# Patient Record
Sex: Male | Born: 1960 | Hispanic: Yes | Marital: Married | Smoking: Former smoker
Health system: Southern US, Community
[De-identification: ages and names within clinical notes are randomized; demographics above are authoritative.]

## PROBLEM LIST (undated history)

## (undated) DIAGNOSIS — Z955 Presence of coronary angioplasty implant and graft: Secondary | ICD-10-CM

## (undated) DIAGNOSIS — E1169 Type 2 diabetes mellitus with other specified complication: Secondary | ICD-10-CM

## (undated) DIAGNOSIS — E7800 Pure hypercholesterolemia, unspecified: Secondary | ICD-10-CM

## (undated) DIAGNOSIS — I252 Old myocardial infarction: Secondary | ICD-10-CM

## (undated) DIAGNOSIS — I10 Essential (primary) hypertension: Secondary | ICD-10-CM

## (undated) DIAGNOSIS — E6609 Other obesity due to excess calories: Secondary | ICD-10-CM

## (undated) DIAGNOSIS — R7303 Prediabetes: Secondary | ICD-10-CM

## (undated) DIAGNOSIS — I255 Ischemic cardiomyopathy: Secondary | ICD-10-CM

## (undated) DIAGNOSIS — E119 Type 2 diabetes mellitus without complications: Secondary | ICD-10-CM

## (undated) DIAGNOSIS — R7301 Impaired fasting glucose: Secondary | ICD-10-CM

## (undated) DIAGNOSIS — D2310 Other benign neoplasm of skin of unspecified eyelid, including canthus: Secondary | ICD-10-CM

## (undated) DIAGNOSIS — I2510 Atherosclerotic heart disease of native coronary artery without angina pectoris: Secondary | ICD-10-CM

## (undated) DIAGNOSIS — F341 Dysthymic disorder: Secondary | ICD-10-CM

## (undated) DIAGNOSIS — E782 Mixed hyperlipidemia: Secondary | ICD-10-CM

## (undated) HISTORY — DX: Other benign neoplasm of skin of unspecified eyelid, including canthus: D23.10

## (undated) HISTORY — DX: Other obesity due to excess calories: E66.09

## (undated) HISTORY — DX: Ischemic cardiomyopathy: I25.5

## (undated) HISTORY — DX: Dysthymic disorder: F34.1

## (undated) HISTORY — DX: Type 2 diabetes mellitus without complications: E11.9

## (undated) HISTORY — DX: Type 2 diabetes mellitus with other specified complication: E78.2

## (undated) HISTORY — PX: CORONARY STENT PLACEMENT: SHX1402

## (undated) HISTORY — DX: Presence of coronary angioplasty implant and graft: Z95.5

## (undated) HISTORY — DX: Impaired fasting glucose: R73.01

## (undated) HISTORY — DX: Essential (primary) hypertension: I10

## (undated) HISTORY — DX: Type 2 diabetes mellitus with other specified complication: E11.69

## (undated) HISTORY — DX: Old myocardial infarction: I25.2

## (undated) HISTORY — DX: Atherosclerotic heart disease of native coronary artery without angina pectoris: I25.10

## (undated) HISTORY — PX: CATARACT EXTRACTION, BILATERAL: SHX1313

## (undated) HISTORY — DX: Prediabetes: R73.03

## (undated) HISTORY — DX: Pure hypercholesterolemia, unspecified: E78.00

---

## 2011-01-18 LAB — HM COLONOSCOPY

## 2018-09-17 ENCOUNTER — Ambulatory Visit (INDEPENDENT_AMBULATORY_CARE_PROVIDER_SITE_OTHER): Payer: 59 | Admitting: Osteopathic Medicine

## 2018-09-17 ENCOUNTER — Encounter: Payer: Self-pay | Admitting: Osteopathic Medicine

## 2018-09-17 VITALS — Temp 99.0°F | Ht 70.0 in | Wt 235.0 lb

## 2018-09-17 DIAGNOSIS — I517 Cardiomegaly: Secondary | ICD-10-CM | POA: Insufficient documentation

## 2018-09-17 DIAGNOSIS — I252 Old myocardial infarction: Secondary | ICD-10-CM | POA: Diagnosis not present

## 2018-09-17 DIAGNOSIS — I2119 ST elevation (STEMI) myocardial infarction involving other coronary artery of inferior wall: Secondary | ICD-10-CM | POA: Diagnosis not present

## 2018-09-17 DIAGNOSIS — F640 Transsexualism: Secondary | ICD-10-CM | POA: Insufficient documentation

## 2018-09-17 DIAGNOSIS — E1169 Type 2 diabetes mellitus with other specified complication: Secondary | ICD-10-CM | POA: Insufficient documentation

## 2018-09-17 DIAGNOSIS — R7303 Prediabetes: Secondary | ICD-10-CM

## 2018-09-17 DIAGNOSIS — Z0000 Encounter for general adult medical examination without abnormal findings: Secondary | ICD-10-CM

## 2018-09-17 HISTORY — DX: Transsexualism: F64.0

## 2018-09-17 NOTE — Progress Notes (Signed)
Virtual Visit via Video (App used: Doximity) Note  I connected with      [name] on 09/17/18 at 4:40 PM by a telemedicine application and verified that I am speaking with the correct person using two identifiers.  Patient is at home I am in office   I discussed the limitations of evaluation and management by telemedicine and the availability of in person appointments. The patient expressed understanding and agreed to proceed.  History of Present Illness: Eric Newton is a 58 y.o. transfeminie but goes by male pronouns and nickname "Eric Newton" who would like to discuss new to establish care    Very pleasant patient new to establish care.  Specifically asked about preferred name/pronouns.  Patient states that they go by masculine pronouns and identify as male "because insurance will not cover my transition."  Speaks very good English, I am not sure if there might be some kind of language barrier here or if patient truly just feels more comfortable going by assigned male name and pronouns, other notes in previous charts mention patient goes by male pronouns.  (Can address this more at future visit) patient is on male hormone therapy.  Reports history of MI x2 several years ago.  Patient appears to be on appropriate secondary prevention with aspirin, statin, metoprolol and is under the care of cardiology.  Last visit 08/10/2018.  Recommending dual antiplatelet therapy as long as patient is able to do so safely, caution with continuation of hormone therapy but certainly reasonable to continue male hormones for quality-of-life purposes.  Reports history of prediabetes.  Last A1c was at goal a few months ago.  Gets blood work about every 6 months or so.      Observations/Objective: Temp 99 F (37.2 C) (Oral)   Ht 5\' 10"  (1.778 m)   Wt 235 lb (106.6 kg)   BMI 33.72 kg/m  BP Readings from Last 3 Encounters:  No data found for BP   Exam: Normal Speech.  NAD  Lab and  Radiology Results No results found for this or any previous visit (from the past 72 hour(s)). No results found.     Assessment and Plan: 58 y.o. male with The primary encounter diagnosis was History of inferior wall myocardial infarction. 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   PDMP not reviewed this encounter. No orders of the defined types were placed in this encounter.  No orders of the defined types were placed in this encounter.  Patient Instructions  Plan:  Orders are in place for you to get blood work done the week prior to your annual visit which will be due in December.  Please come to the lab on the first floor of the building to get blood work done, you do not need an appointment, please come fasting meaning no food after midnight that you can take your medications as usual with a sip of water.  Please let me know if you are coming due for refills.  Have your pharmacy contact my office with refill requests if needed.  Looking forward to meeting you face-to-face in December, please let me know sooner if any other issues, and please feel free to schedule with 1 of our sports medicine physicians if hip pain worsens or changes!     Instructions sent via MyChart. If MyChart not available, pt was given option for info via personal e-mail w/ no  guarantee of protected health info over unsecured e-mail communication, and MyChart sign-up instructions were included.   Follow Up Instructions: Return in about 3 months (around 12/17/2018) for ANNUAL (get labs prior to visit, orders are in), VISIT [place].    I discussed the assessment and treatment plan with the patient. The patient was provided an opportunity to ask questions and all were answered. The patient agreed with the plan and demonstrated an understanding of the  instructions.   The patient was advised to call back or seek an in-person evaluation if any new concerns, if symptoms worsen or if the condition fails to improve as anticipated.  30 minutes of non-face-to-face time was provided during this encounter.    Labs ordered for future visit. Annual physical / preventive care was NOT performed or billed today.                    Historical information moved to improve visibility of documentation.  Past Medical History:  Diagnosis Date  . High cholesterol   . Hypertension   . Pre-diabetes    Past Surgical History:  Procedure Laterality Date  . CATARACT EXTRACTION, BILATERAL     30 years ago  . CORONARY STENT PLACEMENT     2006 and 2019   Social History   Tobacco Use  . Smoking status: Former Smoker    Types: Cigarettes    Quit date: 2014    Years since quitting: 6.7  . Smokeless tobacco: Former Systems developer  . Tobacco comment: 2-3 cigs a day when he did smoke  Substance Use Topics  . Alcohol use: Yes    Alcohol/week: 3.0 standard drinks    Types: 3 Standard drinks or equivalent per week    Comment: Drinks socially on weekends   family history includes Breast cancer in his mother; Cancer in his maternal grandfather and maternal grandmother; Hypertension in his mother; Prostate cancer in his father.  Medications: Current Outpatient Medications  Medication Sig Dispense Refill  . albuterol (VENTOLIN HFA) 108 (90 Base) MCG/ACT inhaler Inhale into the lungs.    . ASPIRIN 81 PO Aspir-81    . b complex vitamins tablet Take by mouth.    . escitalopram (LEXAPRO) 10 MG tablet TAKE 1 TABLET BY MOUTH EVERY DAY    . estradiol (ESTRACE) 2 MG tablet Take 4 mg by mouth daily.     [name] ezetimibe (ZETIA) 10 MG tablet Take by mouth.    . fenofibrate (TRICOR) 145 MG tablet Take by mouth.    [name] ketoconazole (NIZORAL) 2 % cream Apply topically.    . medroxyPROGESTERone (PROVERA) 2.5 MG tablet Take 2.5 mg by mouth daily.     . metFORMIN  (GLUCOPHAGE) 500 MG tablet metformin 500 mg tablet  TAKE 1 TABLET (500 MG TOTAL) BY MOUTH 2 (TWO) TIMES DAILY WITH MEALS.    . methocarbamol (ROBAXIN) 750 MG tablet TK 1 T PO Q 4 H PRN    . metoprolol succinate (TOPROL-XL) 25 MG 24 hr tablet metoprolol succinate ER 25 mg tablet,extended release 24 hr  TAKE 1 TABLET (25 MG TOTAL) BY MOUTH DAILY.    . nitroGLYCERIN (NITROSTAT) 0.4 MG SL tablet Nitrostat 0.4 mg sublingual tablet  Place 1 tablet as needed by sublingual route.    . prasugrel (EFFIENT) 10 MG TABS tablet Effient 10 mg tablet  TAKE 1 TABLET EVERY DAY    . rosuvastatin (CRESTOR) 40 MG tablet rosuvastatin 40 mg tablet  TAKE 1 TABLET (  40 MG TOTAL) BY MOUTH DAILY.    [name] spironolactone (ALDACTONE) 100 MG tablet One tab QAM and half tab QPM    . triamcinolone ointment (KENALOG) 0.1 % Apply topically.    . clopidogrel (PLAVIX) 75 MG tablet Take by mouth.     No current facility-administered medications for this visit.    No Known Allergies  PDMP not reviewed this encounter. No orders of the defined types were placed in this encounter.  No orders of the defined types were placed in this encounter.

## 2018-09-17 NOTE — Progress Notes (Signed)
Tried contacting pt at 415 pm, no answer. Unable to leave a vm msg. Vm not set up.

## 2018-09-17 NOTE — Patient Instructions (Signed)
Plan:  Orders are in place for you to get blood work done the week prior to your annual visit which will be due in December.  Please come to the lab on the first floor of the building to get blood work done, you do not need an appointment, please come fasting meaning no food after midnight that you can take your medications as usual with a sip of water.  Please let me know if you are coming due for refills.  Have your pharmacy contact my office with refill requests if needed.  Looking forward to meeting you face-to-face in December, please let me know sooner if any other issues, and please feel free to schedule with 1 of our sports medicine physicians if hip pain worsens or changes!

## 2018-09-19 ENCOUNTER — Encounter: Payer: Self-pay | Admitting: Osteopathic Medicine

## 2018-10-21 ENCOUNTER — Encounter: Payer: Self-pay | Admitting: Osteopathic Medicine

## 2018-10-21 DIAGNOSIS — Z1231 Encounter for screening mammogram for malignant neoplasm of breast: Secondary | ICD-10-CM

## 2018-10-22 NOTE — Telephone Encounter (Signed)
Mammogram order pended with DX of screening for breast cancer.. please review order and let me know if this is OK?

## 2018-10-22 NOTE — Telephone Encounter (Signed)
Signed.

## 2018-10-25 ENCOUNTER — Encounter: Payer: 59 | Admitting: Osteopathic Medicine

## 2018-10-27 ENCOUNTER — Encounter: Payer: Self-pay | Admitting: Osteopathic Medicine

## 2018-11-13 ENCOUNTER — Telehealth: Payer: Self-pay | Admitting: Osteopathic Medicine

## 2018-11-13 ENCOUNTER — Encounter: Payer: Self-pay | Admitting: Osteopathic Medicine

## 2018-11-13 DIAGNOSIS — R7303 Prediabetes: Secondary | ICD-10-CM

## 2018-11-13 MED ORDER — METFORMIN HCL 500 MG PO TABS: ORAL_TABLET | ORAL | 0 refills | Status: DC

## 2018-11-13 NOTE — Telephone Encounter (Signed)
Med refilled.

## 2018-11-13 NOTE — Telephone Encounter (Signed)
Ordered A1C for his physical in Decewmber. KG LPN

## 2018-11-22 ENCOUNTER — Ambulatory Visit (INDEPENDENT_AMBULATORY_CARE_PROVIDER_SITE_OTHER): Payer: 59

## 2018-11-22 ENCOUNTER — Other Ambulatory Visit: Payer: Self-pay

## 2018-11-22 DIAGNOSIS — Z1231 Encounter for screening mammogram for malignant neoplasm of breast: Secondary | ICD-10-CM

## 2018-11-28 ENCOUNTER — Other Ambulatory Visit: Payer: Self-pay | Admitting: Osteopathic Medicine

## 2018-11-28 DIAGNOSIS — R928 Other abnormal and inconclusive findings on diagnostic imaging of breast: Secondary | ICD-10-CM

## 2018-12-06 ENCOUNTER — Ambulatory Visit
Admission: RE | Admit: 2018-12-06 | Discharge: 2018-12-06 | Disposition: A | Discharge status: Home / Self Care | Payer: 59 | Source: Ambulatory Visit | Attending: Osteopathic Medicine | Admitting: Osteopathic Medicine

## 2018-12-06 ENCOUNTER — Other Ambulatory Visit: Payer: Self-pay

## 2018-12-06 DIAGNOSIS — R928 Other abnormal and inconclusive findings on diagnostic imaging of breast: Secondary | ICD-10-CM

## 2018-12-10 ENCOUNTER — Other Ambulatory Visit: Payer: Self-pay

## 2018-12-10 ENCOUNTER — Encounter: Payer: Self-pay | Admitting: Osteopathic Medicine

## 2018-12-10 DIAGNOSIS — E782 Mixed hyperlipidemia: Secondary | ICD-10-CM

## 2018-12-10 DIAGNOSIS — Z125 Encounter for screening for malignant neoplasm of prostate: Secondary | ICD-10-CM

## 2018-12-10 DIAGNOSIS — Z0000 Encounter for general adult medical examination without abnormal findings: Secondary | ICD-10-CM

## 2018-12-10 DIAGNOSIS — F640 Transsexualism: Secondary | ICD-10-CM

## 2018-12-10 DIAGNOSIS — R7303 Prediabetes: Secondary | ICD-10-CM

## 2018-12-10 MED ORDER — METFORMIN HCL 500 MG PO TABS: ORAL_TABLET | ORAL | 0 refills | Status: DC

## 2018-12-12 ENCOUNTER — Other Ambulatory Visit: Payer: Self-pay

## 2018-12-12 DIAGNOSIS — R7303 Prediabetes: Secondary | ICD-10-CM

## 2018-12-12 DIAGNOSIS — E782 Mixed hyperlipidemia: Secondary | ICD-10-CM

## 2018-12-12 DIAGNOSIS — F640 Transsexualism: Secondary | ICD-10-CM

## 2018-12-12 DIAGNOSIS — Z0000 Encounter for general adult medical examination without abnormal findings: Secondary | ICD-10-CM

## 2018-12-18 LAB — COMPREHENSIVE METABOLIC PANEL
ALT: 24 IU/L (ref 0–44)
AST: 25 IU/L (ref 0–40)
Albumin/Globulin Ratio: 1.8 (ref 1.2–2.2)
Albumin: 4.2 g/dL (ref 3.8–4.9)
Alkaline Phosphatase: 58 IU/L (ref 39–117)
BUN/Creatinine Ratio: 15 (ref 9–20)
BUN: 20 mg/dL (ref 6–24)
Bilirubin Total: 0.4 mg/dL (ref 0.0–1.2)
CO2: 24 mmol/L (ref 20–29)
Calcium: 9 mg/dL (ref 8.7–10.2)
Chloride: 101 mmol/L (ref 96–106)
Creatinine, Ser: 1.33 mg/dL — ABNORMAL HIGH (ref 0.76–1.27)
GFR calc Af Amer: 67 mL/min/{1.73_m2} (ref >59)
GFR calc non Af Amer: 58 mL/min/{1.73_m2} — ABNORMAL LOW (ref >59)
Globulin, Total: 2.3 g/dL (ref 1.5–4.5)
Glucose: 115 mg/dL — ABNORMAL HIGH (ref 65–99)
Potassium: 5.1 mmol/L (ref 3.5–5.2)
Sodium: 136 mmol/L (ref 134–144)
Total Protein: 6.5 g/dL (ref 6.0–8.5)

## 2018-12-18 LAB — LIPID PANEL WITH LDL/HDL RATIO
Cholesterol, Total: 111 mg/dL (ref 100–199)
HDL: 36 mg/dL — ABNORMAL LOW (ref >39)
LDL Chol Calc (NIH): 45 mg/dL (ref 0–99)
LDL/HDL Ratio: 1.3 ratio (ref 0.0–3.6)
Triglycerides: 182 mg/dL — ABNORMAL HIGH (ref 0–149)
VLDL Cholesterol Cal: 30 mg/dL (ref 5–40)

## 2018-12-18 LAB — CBC WITH DIFFERENTIAL/PLATELET
Basophils Absolute: 0.1 10*3/uL (ref 0.0–0.2)
Basos: 1 %
EOS (ABSOLUTE): 0.4 10*3/uL (ref 0.0–0.4)
Eos: 6 %
Hematocrit: 42.3 % (ref 37.5–51.0)
Hemoglobin: 13.8 g/dL (ref 13.0–17.7)
Immature Grans (Abs): 0 10*3/uL (ref 0.0–0.1)
Immature Granulocytes: 0 %
Lymphocytes Absolute: 1.6 10*3/uL (ref 0.7–3.1)
Lymphs: 21 %
MCH: 30 pg (ref 26.6–33.0)
MCHC: 32.6 g/dL (ref 31.5–35.7)
MCV: 92 fL (ref 79–97)
Monocytes Absolute: 0.8 10*3/uL (ref 0.1–0.9)
Monocytes: 11 %
Neutrophils Absolute: 4.7 10*3/uL (ref 1.4–7.0)
Neutrophils: 61 %
Platelets: 231 10*3/uL (ref 150–450)
RBC: 4.6 x10E6/uL (ref 4.14–5.80)
RDW: 13.2 % (ref 11.6–15.4)
WBC: 7.7 10*3/uL (ref 3.4–10.8)

## 2018-12-18 LAB — PSA TOTAL (REFLEX TO FREE): Prostate Specific Ag, Serum: 0.2 ng/mL (ref 0.0–4.0)

## 2018-12-18 LAB — HEMOGLOBIN A1C
Est. average glucose Bld gHb Est-mCnc: 146 mg/dL
Hgb A1c MFr Bld: 6.7 % — ABNORMAL HIGH (ref 4.8–5.6)

## 2018-12-18 LAB — TESTOSTERONE: Testosterone: 350 ng/dL (ref 264–916)

## 2018-12-20 ENCOUNTER — Other Ambulatory Visit: Payer: Self-pay

## 2018-12-20 ENCOUNTER — Ambulatory Visit (INDEPENDENT_AMBULATORY_CARE_PROVIDER_SITE_OTHER): Payer: 59 | Admitting: Osteopathic Medicine

## 2018-12-20 ENCOUNTER — Encounter: Payer: Self-pay | Admitting: Osteopathic Medicine

## 2018-12-20 VITALS — BP 124/78 | HR 69 | Temp 98.1°F | Wt 240.0 lb

## 2018-12-20 DIAGNOSIS — I252 Old myocardial infarction: Secondary | ICD-10-CM | POA: Diagnosis not present

## 2018-12-20 DIAGNOSIS — R7989 Other specified abnormal findings of blood chemistry: Secondary | ICD-10-CM

## 2018-12-20 DIAGNOSIS — R7303 Prediabetes: Secondary | ICD-10-CM

## 2018-12-20 DIAGNOSIS — Z0000 Encounter for general adult medical examination without abnormal findings: Secondary | ICD-10-CM | POA: Diagnosis not present

## 2018-12-20 NOTE — Patient Instructions (Addendum)
General Preventive Care  Most recent routine screening lipids/other labs: already done!   Tobacco: don't!   Alcohol: responsible moderation is ok for most adults - if you have concerns about your alcohol intake, please talk to me!   Exercise: as tolerated to reduce risk of cardiovascular disease and diabetes. Strength training will also prevent osteoporosis.   Mental health: if need for mental health care (medicines, counseling, other), or concerns about moods, please let me know!   Sexual health: if need for STD testing, or if concerns with libido/pain problems, please let me know!   Advanced Directive: Living Will and/or Healthcare Power of Attorney recommended for all adults, regardless of age or health.  Vaccines  Flu vaccine: recommended for almost everyone, every fall.   Shingles vaccine: all done!  Pneumonia vaccines: will update at age 50  Tetanus booster: Tdap recommended every 10 years. Due 2025 Cancer screenings   Colon cancer screening: need records / date of test for colonoscopy!   Prostate cancer screening: PSA blood test around age 59 w/ annual blood test - this was normal  Breast cancer screening: mammogram recommended annually  Lung cancer screening: CT chest every year for those age 107 to 59 years old with ?30 pack year smoking history, who either currently smoke or have quit within the past 15 years. Infection screenings . HIV: recommended screening at least once age 4-65, more often as needed. . Gonorrhea/Chlamydia: screening as needed.. . Hepatitis C: recommended for anyone born 60-1966 . TB: certain at-risk populations, or depending on work requirements and/or travel history Other . Bone Density Test: recommended at age 23 . Abdominal Aortic Aneurysm: screening with ultrasound recommended once age 48-75 since you used to be a smoker

## 2018-12-20 NOTE — Progress Notes (Signed)
HPI: Eric Newton is a 58 y.o. 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  he presents to [facility] today, 12/20/18,  for chief complaint of:  Annual physical  Patient has no major complaints or concerns today.  This is a very pleasant gender nonconforming individual who identifies as male but in general/in public goes by male name/pronouns.  Cardiology notes: Plavix, ASA  She has stopped taking the progesterone and feels about the same off of this medicine is on it.  Would rather just stay off.     Past medical, surgical, social and family history reviewed:  Patient Active Problem List   Diagnosis Date Noted  . 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 History of inferior wall myocardial infarction 09/17/2018  . Additional heart attack (inferolateral wall) ([place]) 09/17/2018  . Left atrial enlargement 09/17/2018  . Prediabetes 09/17/2018    Past Surgical History:  Procedure Laterality Date  . CATARACT EXTRACTION, BILATERAL     30 years ago  . CORONARY STENT PLACEMENT     2006 and 2019    Social History   Tobacco Use  . Smoking status: Former Smoker    Types: Cigarettes    Quit date: 2014    Years since quitting: 6.9  . Smokeless tobacco: Former Systems developer  . Tobacco comment: 2-3 cigs a day when he  did smoke  Substance Use Topics  . Alcohol use: Yes    Alcohol/week: 3.0 standard drinks    Types: 3 Standard drinks or equivalent per week    Comment: Drinks socially on weekends    Family History  Problem Relation Age of Onset  . Breast cancer Mother 68  . Hypertension Mother   . Prostate cancer Father   . Cancer Maternal Grandmother   . Cancer Maternal Grandfather      Current medication list and allergy/intolerance information reviewed:    Current Outpatient Medications  Medication Sig Dispense Refill  . albuterol (VENTOLIN HFA) 108 (90 Base) MCG/ACT inhaler Inhale into the lungs.    . ASPIRIN 81 PO Aspir-81    . b complex vitamins tablet Take by mouth.    . clopidogrel (PLAVIX) 75 MG tablet Take by mouth.    . escitalopram (LEXAPRO) 10 MG tablet TAKE 1 TABLET BY MOUTH EVERY DAY    . estradiol (ESTRACE) 2 MG tablet Take 4 mg by mouth daily.     [name] ezetimibe (ZETIA) 10 MG tablet Take by mouth.    . fenofibrate (TRICOR) 145 MG tablet Take by mouth.    [name] ketoconazole (NIZORAL) 2 % cream Apply topically.    . medroxyPROGESTERone (PROVERA) 2.5 MG tablet Take 2.5 mg by mouth daily.     . metFORMIN (GLUCOPHAGE) 500 MG tablet metformin 500 mg tablet  TAKE 1 TABLET (500 MG TOTAL) BY MOUTH 2 (TWO) TIMES DAILY WITH MEALS. 60 tablet 0  . methocarbamol (ROBAXIN) 750 MG tablet TK 1 T PO Q 4 H PRN    .  metoprolol succinate (TOPROL-XL) 25 MG 24 hr tablet metoprolol succinate ER 25 mg tablet,extended release 24 hr  TAKE 1 TABLET (25 MG TOTAL) BY MOUTH DAILY.    . nitroGLYCERIN (NITROSTAT) 0.4 MG SL tablet Nitrostat 0.4 mg sublingual tablet  Place 1 tablet as needed by sublingual route.    . prasugrel (EFFIENT) 10 MG TABS tablet Effient 10 mg tablet  TAKE 1 TABLET EVERY DAY    . rosuvastatin (CRESTOR) 40 MG tablet rosuvastatin 40 mg tablet  TAKE 1 TABLET (40 MG TOTAL) BY MOUTH DAILY.    [name] spironolactone (ALDACTONE) 100 MG tablet One tab QAM and half tab QPM    . triamcinolone ointment  (KENALOG) 0.1 % Apply topically.     No current facility-administered medications for this visit.    No Known Allergies    Review of Systems:  Constitutional:  No  fever, no chills, No recent illness, No unintentional weight changes. No significant fatigue.   HEENT: No  headache, no vision change, no hearing change, No sore throat, No  sinus pressure  Cardiac: No  chest pain, No  pressure, No palpitations  Respiratory:  No  shortness of breath. No  Cough  Gastrointestinal: No  abdominal pain, No  nausea, No  vomiting,  No  blood in stool, No  diarrhea, No  constipation   Musculoskeletal: No new myalgia/arthralgia  Skin: No  Rash, No other wounds/concerning lesions  Genitourinary: No  incontinence, No  abnormal genital bleeding, No abnormal genital discharge  Endocrine: No cold intolerance,  No heat intolerance. No polyuria/polydipsia/polyphagia   Neurologic: No  weakness, No  dizziness  Psychiatric: No  concerns with depression, No  concerns with anxiety, No sleep problems, No mood problems  Exam:  BP 124/78 (BP Location: Left Arm, Patient Position: Sitting, Cuff Size: Normal)   Pulse 69   Temp 98.1 F (36.7 C) (Oral)   Wt 240 lb 0.6 oz (108.9 kg)   BMI 34.44 kg/m   Constitutional: VS see above. General Appearance: alert, well-developed, well-nourished, NAD  Eyes: Normal lids and conjunctive, non-icteric sclera  Ears, Nose, Mouth, Throat: TM normal bilaterally.   Neck: No masses, trachea midline. No thyroid enlargement. No tenderness/mass appreciated. No lymphadenopathy  Respiratory: Normal respiratory effort. no wheeze, no rhonchi, no rales  Cardiovascular: S1/S2 normal, no murmur, no rub/gallop auscultated. RRR. No lower extremity edema.  Gastrointestinal: Nontender, no masses. No hepatomegaly, no splenomegaly. No hernia appreciated. Bowel sounds normal. Rectal exam deferred.   Musculoskeletal: Gait normal. No clubbing/cyanosis of digits.   Neurological:  Normal balance/coordination. No tremor  Skin: warm, dry, intact. No rash/ulcer.   Psychiatric: Normal judgment/insight. Normal mood and affect. Oriented x3.    No results found for this or any previous visit (from the past 72 hour(s)).  No results found.           ASSESSMENT/PLAN: The primary encounter diagnosis was Annual physical exam. Diagnoses of Prediabetes, Elevated serum creatinine, and History of MI (myocardial infarction) were also pertinent to this visit.   Orders Placed This Encounter  Procedures  . COMPLETE METABOLIC PANEL WITH GFR  . LIPID SCREENING  . A1C  . POCT HgB A1C     Patient Instructions  General Preventive Care  Most recent routine screening lipids/other labs: already done!   Tobacco: don't!   Alcohol: responsible moderation is ok for most adults - if you have concerns about your alcohol intake, please talk to me!   Exercise: as tolerated to reduce risk of cardiovascular disease and  diabetes. Strength training will also prevent osteoporosis.   Mental health: if need for mental health care (medicines, counseling, other), or concerns about moods, please let me know!   Sexual health: if need for STD testing, or if concerns with libido/pain problems, please let me know!   Advanced Directive: Living Will and/or Healthcare Power of Attorney recommended for all adults, regardless of age or health.  Vaccines  Flu vaccine: recommended for almost everyone, every fall.   Shingles vaccine: all done!  Pneumonia vaccines: will update at age 28  Tetanus booster: Tdap recommended every 10 years. Due 2025 Cancer screenings   Colon cancer screening: need records / date of test for colonoscopy!   Prostate cancer screening: PSA blood test around age 44 w/ annual blood test - this was normal  Breast cancer screening: mammogram recommended annually  Lung cancer screening: CT chest every year for those age 64 to 58 years old with ?30 pack year smoking  history, who either currently smoke or have quit within the past 15 years. Infection screenings . HIV: recommended screening at least once age 70-65, more often as needed. . Gonorrhea/Chlamydia: screening as needed.. . Hepatitis C: recommended for anyone born 46-1966 . TB: certain at-risk populations, or depending on work requirements and/or travel history Other . Bone Density Test: recommended at age 55 . Abdominal Aortic Aneurysm: screening with ultrasound recommended once age 90-75 since you used to be a smoker         Visit summary with medication list and pertinent instructions was printed for patient to review. All questions at time of visit were answered - patient instructed to contact office with any additional concerns or updates. ER/RTC precautions were reviewed with the patient.     Please note: voice recognition software was used to produce this document, and typos may escape review. Please contact Dr. Sheppard Coil for any needed clarifications.     Follow-up plan: Return in about 6 months (around 06/20/2019) for labs: hormones and A1C. Annual visit in 1 year / see me sooner if needed .

## 2019-01-08 ENCOUNTER — Other Ambulatory Visit: Payer: Self-pay | Admitting: Nurse Practitioner

## 2019-01-08 ENCOUNTER — Encounter: Payer: Self-pay | Admitting: Osteopathic Medicine

## 2019-01-08 MED ORDER — METFORMIN HCL 500 MG PO TABS: ORAL_TABLET | ORAL | 4 refills | Status: DC

## 2019-01-10 NOTE — Telephone Encounter (Signed)
FYI request faxed

## 2019-01-25 ENCOUNTER — Encounter: Payer: Self-pay | Admitting: Osteopathic Medicine

## 2019-02-15 ENCOUNTER — Encounter: Payer: Self-pay | Admitting: Osteopathic Medicine

## 2019-02-15 NOTE — Telephone Encounter (Signed)
Previously written by historical provider.Please review and send if appropriate.   Also please note patient using 2 different pharmacies.[name]

## 2019-02-18 MED ORDER — ESTRADIOL 2 MG PO TABS: 4.0000 mg | ORAL_TABLET | Freq: Every day | ORAL | 1 refills | Status: DC

## 2019-02-18 MED ORDER — ESCITALOPRAM OXALATE 10 MG PO TABS: 10.0000 mg | ORAL_TABLET | Freq: Every day | ORAL | 3 refills | Status: DC

## 2019-02-18 NOTE — Addendum Note (Signed)
Addended by: Maryla Morrow on: 02/18/2019 06:12 PM   Modules accepted: Orders

## 2019-03-15 ENCOUNTER — Encounter: Payer: Self-pay | Admitting: Osteopathic Medicine

## 2019-03-22 MED ORDER — SPIRONOLACTONE 100 MG PO TABS: ORAL_TABLET | ORAL | 0 refills | Status: DC

## 2019-03-22 NOTE — Telephone Encounter (Signed)
RX pended. Previously written by historical provider

## 2019-04-11 ENCOUNTER — Encounter: Payer: Self-pay | Admitting: Osteopathic Medicine

## 2019-04-29 ENCOUNTER — Encounter: Payer: Self-pay | Admitting: Osteopathic Medicine

## 2019-04-30 MED ORDER — FENOFIBRATE 145 MG PO TABS: 145.0000 mg | ORAL_TABLET | Freq: Every day | ORAL | 3 refills | Status: DC

## 2019-04-30 NOTE — Telephone Encounter (Signed)
RX pended. Written by historical provider.   Please send if OK.

## 2019-06-17 ENCOUNTER — Encounter: Payer: Self-pay | Admitting: Osteopathic Medicine

## 2019-06-17 DIAGNOSIS — F640 Transsexualism: Secondary | ICD-10-CM

## 2019-06-17 DIAGNOSIS — Z0000 Encounter for general adult medical examination without abnormal findings: Secondary | ICD-10-CM

## 2019-06-17 DIAGNOSIS — E782 Mixed hyperlipidemia: Secondary | ICD-10-CM

## 2019-06-17 DIAGNOSIS — R7303 Prediabetes: Secondary | ICD-10-CM

## 2019-06-19 ENCOUNTER — Other Ambulatory Visit: Payer: Self-pay | Admitting: Nurse Practitioner

## 2019-06-24 ENCOUNTER — Telehealth: Payer: Self-pay | Admitting: Osteopathic Medicine

## 2019-06-24 ENCOUNTER — Ambulatory Visit: Payer: 59 | Admitting: Osteopathic Medicine

## 2019-06-24 NOTE — Telephone Encounter (Signed)
Patient is on schedule for this afternoon  I don't have lab results back yet from [facility] - can we confirm he went for blood work? If yes, we need to call [facility] so I can get those results.   If patient hasn't gotten labs yet, please see if he would like to reschedule for sometime after labs done?  Thanks!

## 2019-06-24 NOTE — Telephone Encounter (Signed)
Patient advised and rescheduled.

## 2019-06-26 LAB — CMP14+EGFR
ALT: 22 IU/L (ref 0–44)
AST: 22 IU/L (ref 0–40)
Albumin/Globulin Ratio: 1.6 (ref 1.2–2.2)
Albumin: 4.4 g/dL (ref 3.8–4.9)
Alkaline Phosphatase: 58 IU/L (ref 48–121)
BUN/Creatinine Ratio: 20 (ref 9–20)
BUN: 23 mg/dL (ref 6–24)
Bilirubin Total: 0.4 mg/dL (ref 0.0–1.2)
CO2: 24 mmol/L (ref 20–29)
Calcium: 9.1 mg/dL (ref 8.7–10.2)
Chloride: 100 mmol/L (ref 96–106)
Creatinine, Ser: 1.16 mg/dL (ref 0.76–1.27)
GFR calc Af Amer: 79 mL/min/{1.73_m2} (ref >59)
GFR calc non Af Amer: 69 mL/min/{1.73_m2} (ref >59)
Globulin, Total: 2.7 g/dL (ref 1.5–4.5)
Glucose: 132 mg/dL — ABNORMAL HIGH (ref 65–99)
Potassium: 5.1 mmol/L (ref 3.5–5.2)
Sodium: 135 mmol/L (ref 134–144)
Total Protein: 7.1 g/dL (ref 6.0–8.5)

## 2019-06-26 LAB — CBC
Hematocrit: 42.3 % (ref 37.5–51.0)
Hemoglobin: 14.1 g/dL (ref 13.0–17.7)
MCH: 30.3 pg (ref 26.6–33.0)
MCHC: 33.3 g/dL (ref 31.5–35.7)
MCV: 91 fL (ref 79–97)
Platelets: 233 10*3/uL (ref 150–450)
RBC: 4.65 x10E6/uL (ref 4.14–5.80)
RDW: 13 % (ref 11.6–15.4)
WBC: 7.2 10*3/uL (ref 3.4–10.8)

## 2019-06-26 LAB — LIPID PANEL
Chol/HDL Ratio: 3.8 ratio (ref 0.0–5.0)
Cholesterol, Total: 149 mg/dL (ref 100–199)
HDL: 39 mg/dL — ABNORMAL LOW (ref >39)
LDL Chol Calc (NIH): 80 mg/dL (ref 0–99)
Triglycerides: 174 mg/dL — ABNORMAL HIGH (ref 0–149)
VLDL Cholesterol Cal: 30 mg/dL (ref 5–40)

## 2019-06-26 LAB — PSA TOTAL (REFLEX TO FREE): Prostate Specific Ag, Serum: 0.3 ng/mL (ref 0.0–4.0)

## 2019-06-26 LAB — HEMOGLOBIN A1C
Est. average glucose Bld gHb Est-mCnc: 148 mg/dL
Hgb A1c MFr Bld: 6.8 % — ABNORMAL HIGH (ref 4.8–5.6)

## 2019-06-26 LAB — TESTOSTERONE: Testosterone: 365 ng/dL (ref 264–916)

## 2019-06-27 ENCOUNTER — Ambulatory Visit (INDEPENDENT_AMBULATORY_CARE_PROVIDER_SITE_OTHER): Payer: 59 | Admitting: Osteopathic Medicine

## 2019-06-27 ENCOUNTER — Encounter: Payer: Self-pay | Admitting: Osteopathic Medicine

## 2019-06-27 VITALS — BP 108/72 | HR 78 | Temp 98.1°F | Wt 240.1 lb

## 2019-06-27 DIAGNOSIS — R7303 Prediabetes: Secondary | ICD-10-CM

## 2019-06-27 DIAGNOSIS — I252 Old myocardial infarction: Secondary | ICD-10-CM

## 2019-06-27 DIAGNOSIS — F640 Transsexualism: Secondary | ICD-10-CM | POA: Diagnosis not present

## 2019-06-27 MED ORDER — ESTRADIOL 2 MG PO TABS: 4.0000 mg | ORAL_TABLET | Freq: Every day | ORAL | 1 refills | Status: DC

## 2019-06-27 MED ORDER — SPIRONOLACTONE 100 MG PO TABS: ORAL_TABLET | ORAL | 1 refills | Status: DC

## 2019-06-27 NOTE — Progress Notes (Signed)
Eric Newton is a 59 y.o. adult who presents to  [place] at [facility]  today, 06/27/19, seeking care for the following:  . Routine follow up on hormone therapy     ASSESSMENT & PLAN with other pertinent findings:  The primary encounter diagnosis was Trans-sexualism with asexual history. Diagnoses of Gender dysphoria in adult, Prediabetes, and History of MI (myocardial infarction) were also pertinent to this visit.   No results found for this or any previous visit (from the past 24 hour(s)).  Will consider higher dose estrogen TG/LDL and A1C not at goal  Pt would like to work on diet/exercise/weight  Would consider Vascepa/Lovaza     Patient Instructions  Weight loss: important things to remember  It is hard work! You will have setbacks, but don't get discouraged. The goal is not short-term success, it is long-term health.   Looking at the numbers is important to track your progress and set goals, but how you are feeling and your overall health are the most important things! BMI and pounds and calories and miles logged aren't everything - they are tools to help [place] reach your goals.  You can do this!!!   Things to remember for exercise for weight loss:   Please note - I am not a certified personal trainer. I can present you with ideas and general workout goals, but an exercise program is largely up to you. Find something you can stick with, and something you enjoy!   As you progress in your exercise regimen think about gradually increasing the following, week by week:   intensity (how strenuous is your workout)  frequency (how often you are exercising)  duration (how many minutes at a time you are exercising)  Walking for 20 minutes a day is certainly better than nothing, but more strenuous exercise will develop better cardiovascular fitness.   interval training (high-intensity alternating with low-intensity, think  walk/jog rather than just walk)  muscle strengthening exercises (weight lifting, calisthenics, yoga) - this also helps prevent osteoporosis!   Things to remember for diet changes for weight loss:   Please note - I am not a certified dietician. I can present you with ideas and general diet goals, but a meal plan is largely up to you. I am happy to refer you to a dietician who can give you a detailed meal plan.  Apps/logs are helpful to track how you're eating! It's not realistic to be logging everything you eat forever, but when you're starting a healthy eating lifestyle it's very helpful, and checking in with logs now and then helps you stick to your program!   Calorie restriction with the goal weight loss of no more than one to one and a half pounds per week.   Increase lean protein such as chicken, fish, [place].   Decrease fatty foods such as dairy, butter.   Decrease sugary foods. Avoid sugary drinks such as soda or juice.  Increase fiber found in fruit and vegetables.     No orders of the defined types were placed in this encounter.   Meds ordered this encounter  Medications  . spironolactone (ALDACTONE) 100 MG tablet    Sig: One tablet in the morning and half of a tablet in the afternoon    Dispense:  135 tablet    Refill:  1  . estradiol (ESTRACE) 2 MG tablet    Sig: Take 2 tablets (4 mg total) by mouth daily.    Dispense:  180 tablet    Refill:  1       Follow-up instructions: Return in about 3 months (around 09/27/2019) for IN-OFFICE VISIT RECHECK A1C .                                         BP 108/72 (BP Location: Left Arm, Patient Position: Sitting, Cuff Size: Large)   Pulse 78   Temp 98.1 F (36.7 C) (Oral)   Wt 240 lb 1.3 oz (108.9 kg)   BMI 34.45 kg/m   Current Meds  Medication Sig  . albuterol (VENTOLIN HFA) 108 (90 Base) MCG/ACT inhaler Inhale into the lungs.  . ASPIRIN 81 PO Aspir-81  . b complex vitamins  tablet Take by mouth.  . clopidogrel (PLAVIX) 75 MG tablet Take by mouth.  . escitalopram (LEXAPRO) 10 MG tablet Take 1 tablet (10 mg total) by mouth daily.  [name] estradiol (ESTRACE) 2 MG tablet Take 2 tablets (4 mg total) by mouth daily.  [name] ezetimibe (ZETIA) 10 MG tablet Take by mouth.  . fenofibrate (TRICOR) 145 MG tablet Take 1 tablet (145 mg total) by mouth daily.  . medroxyPROGESTERone (PROVERA) 2.5 MG tablet Take 2.5 mg by mouth daily.   . metFORMIN (GLUCOPHAGE) 500 MG tablet TAKE 1 TABLET BY MOUTH TWICE DAILY WITH MEALS  . methocarbamol (ROBAXIN) 750 MG tablet TK 1 T PO Q 4 H PRN  . metoprolol succinate (TOPROL-XL) 25 MG 24 hr tablet metoprolol succinate ER 25 mg tablet,extended release 24 hr  TAKE 1 TABLET (25 MG TOTAL) BY MOUTH DAILY.  . nitroGLYCERIN (NITROSTAT) 0.4 MG SL tablet Nitrostat 0.4 mg sublingual tablet  Place 1 tablet as needed by sublingual route.  . rosuvastatin (CRESTOR) 40 MG tablet rosuvastatin 40 mg tablet  TAKE 1 TABLET (40 MG TOTAL) BY MOUTH DAILY.  [name] spironolactone (ALDACTONE) 100 MG tablet One tablet in the morning and half of a tablet in the afternoon  . triamcinolone ointment (KENALOG) 0.1 % Apply topically.  . [DISCONTINUED] estradiol (ESTRACE) 2 MG tablet Take 2 tablets (4 mg total) by mouth daily.  . [DISCONTINUED] spironolactone (ALDACTONE) 100 MG tablet One tablet in the morning and half of a tablet in the afternoon    Results for orders placed or performed in visit on 06/17/19 (from the past 72 hour(s))  CBC     Status: None   Collection Time: 06/25/19  8:04 AM  Result Value Ref Range   WBC 7.2 3.4 - 10.8 x10E3/uL   RBC 4.65 4.14 - 5.80 x10E6/uL   Hemoglobin 14.1 13.0 - 17.7 g/dL   Hematocrit 42.3 37.5 - 51.0 %   MCV 91 79 - 97 fL   MCH 30.3 26.6 - 33.0 pg   MCHC 33.3 31 - 35 g/dL   RDW 13.0 11.6 - 15.4 %   Platelets 233 150 - 450 x10E3/uL  Lipid panel     Status: Abnormal   Collection Time: 06/25/19  8:04 AM  Result Value Ref Range    Cholesterol, Total 149 100 - 199 mg/dL   Triglycerides 174 (H) 0 - 149 mg/dL   HDL 39 (L) >39 mg/dL   VLDL Cholesterol Cal 30 5 - 40 mg/dL   LDL Chol Calc (NIH) 80 0 - 99 mg/dL   Chol/HDL Ratio 3.8 0.0 - 5.0 ratio    Comment:  T. Chol/HDL Ratio                                             Men  Women                               1/2 Avg.Risk  3.4    3.3                                   Avg.Risk  5.0    4.4                                2X Avg.Risk  9.6    7.1                                3X Avg.Risk 23.4   11.0   Hemoglobin A1c     Status: Abnormal   Collection Time: 06/25/19  8:04 AM  Result Value Ref Range   Hgb A1c MFr Bld 6.8 (H) 4.8 - 5.6 %    Comment:          Prediabetes: 5.7 - 6.4          Diabetes: >6.4          Glycemic control for adults with diabetes: <7.0    Est. average glucose Bld gHb Est-mCnc 148 mg/dL  PSA Total (Reflex To Free)     Status: None   Collection Time: 06/25/19  8:04 AM  Result Value Ref Range   Prostate Specific Ag, Serum 0.3 0.0 - 4.0 ng/mL    Comment: Roche ECLIA methodology. According to the American Urological Association, Serum PSA should decrease and remain at undetectable levels after radical prostatectomy. The AUA defines biochemical recurrence as an initial PSA value 0.2 ng/mL or greater followed by a subsequent confirmatory PSA value 0.2 ng/mL or greater. Values obtained with different assay methods or kits cannot be used interchangeably. Results cannot be interpreted as absolute evidence of the presence or absence of malignant disease.    Reflex Criteria Comment     Comment: The percent free PSA is performed on a reflex basis only when the total PSA is between 4.0 and 10.0 ng/mL.   CMP14+EGFR     Status: Abnormal   Collection Time: 06/25/19  8:04 AM  Result Value Ref Range   Glucose 132 (H) 65 - 99 mg/dL   BUN 23 6 - 24 mg/dL   Creatinine, Ser 1.16 0.76 - 1.27 mg/dL   GFR calc non Af Amer 69 >59  mL/min/1.73   GFR calc Af Amer 79 >59 mL/min/1.73    Comment: **[facility] currently reports eGFR in compliance with the current**   recommendations of the [employer]. [facility] will   update reporting as new guidelines are published from the NKF-ASN   Task force.    BUN/Creatinine Ratio 20 9 - 20   Sodium 135 134 - 144 mmol/L   Potassium 5.1 3.5 - 5.2 mmol/L   Chloride 100 96 - 106 mmol/L   CO2 24 20 - 29 mmol/L   Calcium 9.1 8.7 - 10.2 mg/dL   Total Protein  7.1 6.0 - 8.5 g/dL   Albumin 4.4 3.8 - 4.9 g/dL   Globulin, Total 2.7 1.5 - 4.5 g/dL   Albumin/Globulin Ratio 1.6 1.2 - 2.2   Bilirubin Total 0.4 0.0 - 1.2 mg/dL   Alkaline Phosphatase 58 48 - 121 IU/L   AST 22 0 - 40 IU/L   ALT 22 0 - 44 IU/L  Testosterone     Status: None   Collection Time: 06/25/19  8:04 AM  Result Value Ref Range   Testosterone 365 264 - 916 ng/dL    Comment: Adult male reference interval is based on a population of healthy nonobese males (BMI <30) between 30 and 13 years old. [place], [place] [phone]. PMID: [id number].     No results found.     All questions at time of visit were answered - patient instructed to contact office with any additional concerns or updates.  ER/RTC precautions were reviewed with the patient as applicable.   Please note: voice recognition software was used to produce this document, and typos may escape review. Please contact Dr. Sheppard Coil for any needed clarifications.   Total encounter time: 30 minutes.

## 2019-06-27 NOTE — Patient Instructions (Signed)
Weight loss: important things to remember  It is hard work! You will have setbacks, but don't get discouraged. The goal is not short-term success, it is long-term health.   Looking at the numbers is important to track your progress and set goals, but how you are feeling and your overall health are the most important things! BMI and pounds and calories and miles logged aren't everything - they are tools to help [place] reach your goals.  You can do this!!!   Things to remember for exercise for weight loss:   Please note - I am not a certified personal trainer. I can present you with ideas and general workout goals, but an exercise program is largely up to you. Find something you can stick with, and something you enjoy!   As you progress in your exercise regimen think about gradually increasing the following, week by week:   intensity (how strenuous is your workout)  frequency (how often you are exercising)  duration (how many minutes at a time you are exercising)  Walking for 20 minutes a day is certainly better than nothing, but more strenuous exercise will develop better cardiovascular fitness.   interval training (high-intensity alternating with low-intensity, think walk/jog rather than just walk)  muscle strengthening exercises (weight lifting, calisthenics, yoga) - this also helps prevent osteoporosis!   Things to remember for diet changes for weight loss:   Please note - I am not a certified dietician. I can present you with ideas and general diet goals, but a meal plan is largely up to you. I am happy to refer you to a dietician who can give you a detailed meal plan.  Apps/logs are helpful to track how you're eating! It's not realistic to be logging everything you eat forever, but when you're starting a healthy eating lifestyle it's very helpful, and checking in with logs now and then helps you stick to your program!   Calorie restriction with the goal weight loss of no more than one  to one and a half pounds per week.   Increase lean protein such as chicken, fish, [place].   Decrease fatty foods such as dairy, butter.   Decrease sugary foods. Avoid sugary drinks such as soda or juice.  Increase fiber found in fruit and vegetables.

## 2019-07-09 ENCOUNTER — Encounter: Payer: Self-pay | Admitting: Osteopathic Medicine

## 2019-07-12 ENCOUNTER — Other Ambulatory Visit: Payer: Self-pay

## 2019-07-12 MED ORDER — ESTRADIOL 2 MG PO TABS: 4.0000 mg | ORAL_TABLET | Freq: Two times a day (BID) | ORAL | 0 refills | Status: DC

## 2019-07-12 MED ORDER — ESTRADIOL 2 MG PO TABS: 4.0000 mg | ORAL_TABLET | Freq: Two times a day (BID) | ORAL | 0 refills | Status: AC

## 2019-07-12 NOTE — Telephone Encounter (Signed)
Routing to covering provider.  °

## 2019-07-24 ENCOUNTER — Other Ambulatory Visit: Payer: Self-pay

## 2019-07-24 DIAGNOSIS — E782 Mixed hyperlipidemia: Secondary | ICD-10-CM

## 2019-07-24 MED ORDER — EZETIMIBE 10 MG PO TABS: 10.0000 mg | ORAL_TABLET | Freq: Every day | ORAL | 0 refills | Status: DC

## 2019-08-17 ENCOUNTER — Other Ambulatory Visit: Payer: Self-pay | Admitting: Osteopathic Medicine

## 2019-08-17 DIAGNOSIS — E782 Mixed hyperlipidemia: Secondary | ICD-10-CM

## 2019-09-03 ENCOUNTER — Other Ambulatory Visit: Payer: Self-pay

## 2019-09-03 ENCOUNTER — Encounter: Payer: Self-pay | Admitting: Osteopathic Medicine

## 2019-09-03 DIAGNOSIS — I517 Cardiomegaly: Secondary | ICD-10-CM

## 2019-09-03 DIAGNOSIS — E782 Mixed hyperlipidemia: Secondary | ICD-10-CM

## 2019-09-03 MED ORDER — METOPROLOL SUCCINATE ER 25 MG PO TB24: ORAL_TABLET | ORAL | 3 refills | Status: DC

## 2019-09-03 MED ORDER — ROSUVASTATIN CALCIUM 40 MG PO TABS: ORAL_TABLET | ORAL | 3 refills | Status: DC

## 2019-09-03 NOTE — Telephone Encounter (Signed)
Patient requesting medication refill Last refill 09/2018 Last Ov- 06/27/2019

## 2019-09-05 ENCOUNTER — Other Ambulatory Visit: Payer: Self-pay

## 2019-09-05 ENCOUNTER — Encounter: Payer: Self-pay | Admitting: Osteopathic Medicine

## 2019-09-05 DIAGNOSIS — E782 Mixed hyperlipidemia: Secondary | ICD-10-CM

## 2019-09-05 DIAGNOSIS — I517 Cardiomegaly: Secondary | ICD-10-CM

## 2019-09-05 MED ORDER — METOPROLOL SUCCINATE ER 25 MG PO TB24: ORAL_TABLET | ORAL | 3 refills | Status: DC

## 2019-09-05 MED ORDER — ROSUVASTATIN CALCIUM 40 MG PO TABS: ORAL_TABLET | ORAL | 3 refills | Status: AC

## 2019-09-19 ENCOUNTER — Encounter: Payer: Self-pay | Admitting: Osteopathic Medicine

## 2019-09-19 DIAGNOSIS — I252 Old myocardial infarction: Secondary | ICD-10-CM

## 2019-09-19 DIAGNOSIS — R7303 Prediabetes: Secondary | ICD-10-CM

## 2019-09-20 NOTE — Telephone Encounter (Signed)
[facility] labs pended.

## 2019-09-20 NOTE — Telephone Encounter (Signed)
Orders in, I'm not sure what needs to be done to send these to the lab he's requesting? Please address. Thanks!

## 2019-09-20 NOTE — Telephone Encounter (Signed)
Task completed. Order has been faxed to [facility] at [phone]. Confirmation was received.

## 2019-09-24 ENCOUNTER — Other Ambulatory Visit: Payer: Self-pay | Admitting: Osteopathic Medicine

## 2019-09-24 DIAGNOSIS — E782 Mixed hyperlipidemia: Secondary | ICD-10-CM

## 2019-09-24 LAB — CMP14+EGFR
ALT: 26 IU/L (ref 0–44)
AST: 20 IU/L (ref 0–40)
Albumin/Globulin Ratio: 1.6 (ref 1.2–2.2)
Albumin: 4.5 g/dL (ref 3.8–4.9)
Alkaline Phosphatase: 61 IU/L (ref 44–121)
BUN/Creatinine Ratio: 19 (ref 10–24)
BUN: 22 mg/dL (ref 8–27)
Bilirubin Total: 0.4 mg/dL (ref 0.0–1.2)
CO2: 24 mmol/L (ref 20–29)
Calcium: 9.3 mg/dL (ref 8.6–10.2)
Chloride: 101 mmol/L (ref 96–106)
Creatinine, Ser: 1.15 mg/dL (ref 0.76–1.27)
GFR calc Af Amer: 80 mL/min/{1.73_m2} (ref >59)
GFR calc non Af Amer: 69 mL/min/{1.73_m2} (ref >59)
Globulin, Total: 2.8 g/dL (ref 1.5–4.5)
Glucose: 132 mg/dL — ABNORMAL HIGH (ref 65–99)
Potassium: 5.2 mmol/L (ref 3.5–5.2)
Sodium: 136 mmol/L (ref 134–144)
Total Protein: 7.3 g/dL (ref 6.0–8.5)

## 2019-09-24 LAB — LIPID PANEL
Chol/HDL Ratio: 3.1 ratio (ref 0.0–5.0)
Cholesterol, Total: 117 mg/dL (ref 100–199)
HDL: 38 mg/dL — ABNORMAL LOW (ref >39)
LDL Chol Calc (NIH): 56 mg/dL (ref 0–99)
Triglycerides: 128 mg/dL (ref 0–149)
VLDL Cholesterol Cal: 23 mg/dL (ref 5–40)

## 2019-09-24 LAB — HEMOGLOBIN A1C
Est. average glucose Bld gHb Est-mCnc: 166 mg/dL
Hgb A1c MFr Bld: 7.4 % — ABNORMAL HIGH (ref 4.8–5.6)

## 2019-09-24 LAB — HEMOGLOBIN: Hemoglobin: 14 g/dL (ref 13.0–17.7)

## 2019-09-25 ENCOUNTER — Encounter: Payer: Self-pay | Admitting: Osteopathic Medicine

## 2019-09-25 ENCOUNTER — Ambulatory Visit (INDEPENDENT_AMBULATORY_CARE_PROVIDER_SITE_OTHER): Payer: 59 | Admitting: Osteopathic Medicine

## 2019-09-25 VITALS — BP 107/70 | HR 93 | Wt 240.0 lb

## 2019-09-25 DIAGNOSIS — I252 Old myocardial infarction: Secondary | ICD-10-CM | POA: Diagnosis not present

## 2019-09-25 DIAGNOSIS — E1169 Type 2 diabetes mellitus with other specified complication: Secondary | ICD-10-CM | POA: Diagnosis not present

## 2019-09-25 DIAGNOSIS — F640 Transsexualism: Secondary | ICD-10-CM

## 2019-09-25 MED ORDER — ESCITALOPRAM OXALATE 20 MG PO TABS
20.0000 mg | ORAL_TABLET | Freq: Every day | ORAL | 1 refills | Status: DC
Start: 2019-09-25 — End: 2020-05-25

## 2019-09-25 MED ORDER — OZEMPIC (0.25 OR 0.5 MG/DOSE) 2 MG/1.5ML ~~LOC~~ SOPN: PEN_INJECTOR | SUBCUTANEOUS | 1 refills | Status: DC

## 2019-09-25 NOTE — Patient Instructions (Signed)
Will increase Lexapro from 10 mg to 20 mg  Will start Ozempic

## 2019-09-25 NOTE — Progress Notes (Signed)
Eric Newton is a 59 y.o. male who [facility]  [facility]  today, 09/25/19, seeking care for the following:  . Review labs: cholesterol, diabetes      ASSESSMENT & PLAN with other pertinent findings:  The primary encounter diagnosis was Type 2 diabetes mellitus with other specified complication, without long-term current use of insulin (Eric Newton). 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   1. Type 2 diabetes mellitus with other specified complication, without long-term current use of insulin (HCC) A1C up a bit from last time  Continue Metformin  Wt Readings from Last 3 Encounters:  09/25/19 240 lb (108.9 kg)  06/27/19 240 lb 1.3 oz (108.9 kg)  12/20/18 240 lb 0.6 oz (108.9 kg)     2. History of inferior wall myocardial infarction Continue ASA, statin, beta blocker BP have been low, would defer to cardiology re: adding ACE/ARB or stopping BB in favor of ACE/ARB  BP Readings from Last 3 Encounters:  09/25/19 107/70  06/27/19 108/72  12/20/18 124/78    3. 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  Identifies as feminine/woman but he finds it easier and safer for him to go by male pronouns at least at this point.  I reiterated to patient that if there is ever any change in chosen name or stated pronouns, please let me know!  4. HLD Continue Crestor, fenofibrate, ezetimibe   No results found for this or any previous visit (from the past 24 hour(s)).     Patient Instructions  Will increase Lexapro from 10 mg to 20 mg  Will start Ozempic    Orders Placed This Encounter  Procedures  . COMPLETE METABOLIC PANEL WITH GFR  . CBC  . CMP14+EGFR  . Lipid panel  . Hemoglobin A1c  . Testosterone    Meds ordered this encounter   Medications  . Semaglutide,0.25 or 0.5MG/DOS, (OZEMPIC, 0.25 OR 0.5 MG/DOSE,) 2 MG/1.5ML SOPN    Sig: Inject 0.25 mg into the skin once a week for 14 days, THEN 0.5 mg once a week.    Dispense:  7.5 mL    Refill:  1  . escitalopram (LEXAPRO) 20 MG tablet    Sig: Take 1 tablet (20 mg total) by mouth daily.    Dispense:  90 tablet    Refill:  1       Follow-up instructions: Return in about 3 months (around 12/25/2019) for recheck labs, get blood work 2-3 days before visit .                                         BP 107/70 (BP Location: Left Arm, Patient Position: Sitting)   Pulse 93   Wt 240 lb (108.9 kg)   SpO2 98%   BMI 34.44 kg/m   No outpatient medications have been marked as taking for the 09/25/19 encounter (Office Visit) with Eric Reeve, DO.    No results found for this or any previous visit (from the past 72 hour(s)).  No results found.     All questions at time of visit were answered - patient instructed to contact office with any additional concerns or updates.  ER/RTC precautions were reviewed with the patient as applicable.  Please note: voice recognition software was used to produce this document, and typos may escape review. Please contact Dr. Sheppard Coil for any needed clarifications.

## 2019-10-20 ENCOUNTER — Other Ambulatory Visit: Payer: Self-pay | Admitting: Osteopathic Medicine

## 2019-10-20 ENCOUNTER — Encounter: Payer: Self-pay | Admitting: Osteopathic Medicine

## 2019-10-20 DIAGNOSIS — E782 Mixed hyperlipidemia: Secondary | ICD-10-CM

## 2019-11-18 ENCOUNTER — Encounter: Payer: Self-pay | Admitting: Osteopathic Medicine

## 2019-11-26 ENCOUNTER — Other Ambulatory Visit: Payer: Self-pay | Admitting: Osteopathic Medicine

## 2019-11-26 DIAGNOSIS — E782 Mixed hyperlipidemia: Secondary | ICD-10-CM

## 2019-11-26 LAB — CMP14+EGFR
ALT: 26 IU/L (ref 0–44)
AST: 26 IU/L (ref 0–40)
Albumin/Globulin Ratio: 1.7 (ref 1.2–2.2)
Albumin: 4.6 g/dL (ref 3.8–4.9)
Alkaline Phosphatase: 58 IU/L (ref 44–121)
BUN/Creatinine Ratio: 13 (ref 10–24)
BUN: 17 mg/dL (ref 8–27)
Bilirubin Total: 0.4 mg/dL (ref 0.0–1.2)
CO2: 20 mmol/L (ref 20–29)
Calcium: 9.6 mg/dL (ref 8.6–10.2)
Chloride: 103 mmol/L (ref 96–106)
Creatinine, Ser: 1.26 mg/dL (ref 0.76–1.27)
GFR calc Af Amer: 71 mL/min/{1.73_m2} (ref >59)
GFR calc non Af Amer: 62 mL/min/{1.73_m2} (ref >59)
Globulin, Total: 2.7 g/dL (ref 1.5–4.5)
Glucose: 106 mg/dL — ABNORMAL HIGH (ref 65–99)
Potassium: 4.7 mmol/L (ref 3.5–5.2)
Sodium: 138 mmol/L (ref 134–144)
Total Protein: 7.3 g/dL (ref 6.0–8.5)

## 2019-11-26 LAB — HEMOGLOBIN A1C
Est. average glucose Bld gHb Est-mCnc: 143 mg/dL
Hgb A1c MFr Bld: 6.6 % — ABNORMAL HIGH (ref 4.8–5.6)

## 2019-11-26 LAB — CBC
Hematocrit: 44.1 % (ref 37.5–51.0)
Hemoglobin: 14.6 g/dL (ref 13.0–17.7)
MCH: 29.6 pg (ref 26.6–33.0)
MCHC: 33.1 g/dL (ref 31.5–35.7)
MCV: 90 fL (ref 79–97)
Platelets: 267 10*3/uL (ref 150–450)
RBC: 4.93 x10E6/uL (ref 4.14–5.80)
RDW: 12.8 % (ref 11.6–15.4)
WBC: 8.3 10*3/uL (ref 3.4–10.8)

## 2019-11-26 LAB — LIPID PANEL
Chol/HDL Ratio: 3.1 ratio (ref 0.0–5.0)
Cholesterol, Total: 120 mg/dL (ref 100–199)
HDL: 39 mg/dL — ABNORMAL LOW (ref >39)
LDL Chol Calc (NIH): 60 mg/dL (ref 0–99)
Triglycerides: 112 mg/dL (ref 0–149)
VLDL Cholesterol Cal: 21 mg/dL (ref 5–40)

## 2019-11-26 LAB — TESTOSTERONE: Testosterone: 296 ng/dL (ref 264–916)

## 2019-11-27 ENCOUNTER — Ambulatory Visit (INDEPENDENT_AMBULATORY_CARE_PROVIDER_SITE_OTHER): Payer: 59 | Admitting: Osteopathic Medicine

## 2019-11-27 VITALS — BP 125/84 | HR 76 | Ht 70.0 in | Wt 239.0 lb

## 2019-11-27 DIAGNOSIS — E782 Mixed hyperlipidemia: Secondary | ICD-10-CM

## 2019-11-27 DIAGNOSIS — I252 Old myocardial infarction: Secondary | ICD-10-CM

## 2019-11-27 DIAGNOSIS — Z0000 Encounter for general adult medical examination without abnormal findings: Secondary | ICD-10-CM

## 2019-11-27 DIAGNOSIS — E1169 Type 2 diabetes mellitus with other specified complication: Secondary | ICD-10-CM

## 2019-11-27 DIAGNOSIS — F640 Transsexualism: Secondary | ICD-10-CM

## 2019-11-27 MED ORDER — OZEMPIC (0.25 OR 0.5 MG/DOSE) 2 MG/1.5ML ~~LOC~~ SOPN: 0.5000 mg | PEN_INJECTOR | SUBCUTANEOUS | 5 refills | Status: AC

## 2019-11-27 MED ORDER — EZETIMIBE 10 MG PO TABS: ORAL_TABLET | ORAL | 3 refills | Status: DC

## 2019-11-27 NOTE — Progress Notes (Signed)
HPI: Eric Newton is a 59 y.o. adult who  has a past medical history of Benign neoplasm of skin of eyelid, Class 1 obesity due to excess calories with serious comorbidity and body mass index (BMI) of 33.0 to 33.9 in adult, Coronary artery disease involving native coronary artery of native heart without angina pectoris, Dyslipidemia with low high density lipoprotein (HDL) cholesterol with hypertriglyceridemia due to type 2 diabetes mellitus ([place]), Dysthymia, High cholesterol, Hypertension, IFG (impaired fasting glucose), Ischemic cardiomyopathy, Myocardial infarct, old, Pre-diabetes, S/P coronary artery stent placement, and Type 2 diabetes mellitus without complication, without long-term current use of insulin ([place]).  he presents to [facility] today, 11/27/19,  for chief complaint of:  Annual physical   Feeling well otherwise Reviewed labs - no concerns     ASSESSMENT/PLAN: The primary encounter diagnosis was Annual physical exam. Diagnoses of Elevated triglycerides with high cholesterol, Type 2 diabetes mellitus with other specified complication, without long-term current use of insulin ([place]), History of MI (myocardial infarction), and Gender dysphoria in adult were also pertinent to this visit.  1. Annual physical exam See below for review of preventive care  2. Elevated triglycerides with high cholesterol Refilled Rx  3. Type 2 diabetes mellitus with other specified complication, without long-term current use of insulin (HCC) Stable and at goal  4. History of MI (myocardial infarction) Continue BB, Statin, ASA, may condier low dose ACE/ARB, following w/ cardiology   5. 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 I asked Eric Newton to confirm this with insurance - most plans should cover care for  trans individuals. I think this discrepancy in the chart may also be why mammograms for screening are "not able to be done" at our location. I'll look into this a bit.    No orders of the defined types were placed in this encounter.    Meds ordered this encounter  Medications  . ezetimibe (ZETIA) 10 MG tablet    Sig: TAKE 1 TABLET(10 MG) BY MOUTH DAILY    Dispense:  90 tablet    Refill:  3  . Semaglutide,0.25 or 0.5MG /DOS, (OZEMPIC, 0.25 OR 0.5 MG/DOSE,) 2 MG/1.5ML SOPN    Sig: Inject 0.5 mg into the skin once a week.    Dispense:  7.5 mL    Refill:  5    Patient Instructions  General Preventive Care  Most recent routine screening labs: see attached.   Blood pressure goal 130/80 or less.   Tobacco: don't!   Alcohol: responsible moderation is ok for most adults - if you have concerns about your alcohol intake, please talk to me!   Exercise: as tolerated to reduce risk of cardiovascular disease and diabetes. Strength training will also prevent osteoporosis.   Mental health: if need for mental health care (medicines, counseling, other), or concerns about moods, please let me know!   Sexual / Reproductive health: if need for STD testing, or if concerns with libido/pain problems, please let me know!   Advanced Directive: Living Will and/or Healthcare Power of Attorney recommended for all adults, regardless of age or health.  Vaccines  Flu vaccine: for almost everyone, every fall.   Shingles vaccine: done!   Pneumonia vaccines: after age 28.  Tetanus booster: every 10 years - due 2025  COVID vaccine: THANKS for getting your vaccine! :) Cancer screenings  Colon cancer screening: for everyone age 25-75. Colonoscopy due 01/2021  Prostate cancer screening: PSA blood test age 78-71  Breast cancer screening: let's try for mammogram in 01/2020 and hopefully no insurance issues!   Lung cancer screening: CT chest every year for those aged 50 to 73 years who have a 20  pack-year smoking history and currently smoke or have quit within the past 15 years  Infection screenings  . HIV: recommended screening at least once age 73-65, more often as needed. . Gonorrhea/Chlamydia: screening as needed. . Hepatitis C: recommended once for everyone age 60-75 . TB: certain at-risk populations, or depending on work requirements and/or travel history Other . Bone Density Test: recommended at age 55     Follow-up plan: Return in about 4 months (around 03/26/2020) for MONITOR A1C, SEE ME SOONER IF NEEDED! [name]                                                 ################################################# ################################################# ################################################# #################################################    Current Meds  Medication Sig  . albuterol (VENTOLIN HFA) 108 (90 Base) MCG/ACT inhaler Inhale into the lungs.  . ASPIRIN 81 PO Aspir-81  . escitalopram (LEXAPRO) 20 MG tablet Take 1 tablet (20 mg total) by mouth daily.  [name] ezetimibe (ZETIA) 10 MG tablet TAKE 1 TABLET(10 MG) BY MOUTH DAILY  . fenofibrate (TRICOR) 145 MG tablet Take 1 tablet (145 mg total) by mouth daily.  . metFORMIN (GLUCOPHAGE) 500 MG tablet TAKE 1 TABLET BY MOUTH TWICE DAILY WITH MEALS  . methocarbamol (ROBAXIN) 750 MG tablet TK 1 T PO Q 4 H PRN  . metoprolol succinate (TOPROL-XL) 25 MG 24 hr tablet metoprolol succinate ER 25 mg tablet,extended release 24 hr  TAKE 1 TABLET (25 MG TOTAL) BY MOUTH DAILY.  . rosuvastatin (CRESTOR) 40 MG tablet rosuvastatin 40 mg tablet  TAKE 1 TABLET (40 MG TOTAL) BY MOUTH DAILY.  [name] Semaglutide,0.25 or 0.5MG /DOS, (OZEMPIC, 0.25 OR 0.5 MG/DOSE,) 2 MG/1.5ML SOPN Inject 0.5 mg into the skin once a week.  . spironolactone (ALDACTONE) 100 MG tablet One tablet in the morning and half of a tablet in the afternoon  . [DISCONTINUED] ezetimibe (ZETIA) 10 MG tablet TAKE 1 TABLET(10  MG) BY MOUTH DAILY  . [DISCONTINUED] Semaglutide,0.25 or 0.5MG /DOS, (OZEMPIC, 0.25 OR 0.5 MG/DOSE,) 2 MG/1.5ML SOPN Inject 0.25 mg into the skin once a week for 14 days, THEN 0.5 mg once a week.    No Known Allergies     Review of Systems: Pertinent (+) and (-) ROS in HPI as above   Exam:  BP 125/84   Pulse 76   Ht 5\' 10"  (1.778 m)   Wt 239 lb (108.4 kg)   SpO2 98%   BMI 34.29 kg/m   Constitutional: VS see above. General Appearance: alert, well-developed, well-nourished, NAD  Neck: No masses, trachea midline.   Respiratory: Normal respiratory effort. no wheeze, no rhonchi, no rales  Cardiovascular: S1/S2 normal, no murmur, no rub/gallop auscultated. RRR.   Musculoskeletal: Gait normal. Symmetric and independent movement of all extremities  Abdominal: non-tender, non-distended, no appreciable organomegaly, neg Murphy's, BS [id number]  Neurological: Normal balance/coordination. No tremor.  Skin: warm, dry, intact.   Psychiatric: Normal judgment/insight. Normal mood and affect. Oriented x3.       Visit summary with medication list and pertinent instructions was printed for patient to  review, patient was advised to alert [place] if any updates are needed. All questions at time of visit were answered - patient instructed to contact office with any additional concerns. ER/RTC precautions were reviewed with the patient and understanding verbalized.         Please note: voice recognition software was used to produce this document, and typos may escape review. Please contact Dr. Sheppard Coil for any needed clarifications.    Follow up plan: Return in about 4 months (around 03/26/2020) for MONITOR A1C, SEE ME SOONER IF NEEDED! [name]

## 2019-11-27 NOTE — Patient Instructions (Addendum)
General Preventive Care  Most recent routine screening labs: see attached.   Blood pressure goal 130/80 or less.   Tobacco: don't!   Alcohol: responsible moderation is ok for most adults - if you have concerns about your alcohol intake, please talk to me!   Exercise: as tolerated to reduce risk of cardiovascular disease and diabetes. Strength training will also prevent osteoporosis.   Mental health: if need for mental health care (medicines, counseling, other), or concerns about moods, please let me know!   Sexual / Reproductive health: if need for STD testing, or if concerns with libido/pain problems, please let me know!   Advanced Directive: Living Will and/or Healthcare Power of Attorney recommended for all adults, regardless of age or health.  Vaccines  Flu vaccine: for almost everyone, every fall.   Shingles vaccine: done!   Pneumonia vaccines: after age 34.  Tetanus booster: every 10 years - due 2025  COVID vaccine: THANKS for getting your vaccine! :) Cancer screenings   Colon cancer screening: for everyone age 39-75. Colonoscopy due 01/2021  Prostate cancer screening: PSA blood test age 72-71  Breast cancer screening: let's try for mammogram in 01/2020 and hopefully no insurance issues!   Lung cancer screening: CT chest every year for those aged 72 to 66 years who have a 20 pack-year smoking history and currently smoke or have quit within the past 15 years  Infection screenings  . HIV: recommended screening at least once age 54-65, more often as needed. . Gonorrhea/Chlamydia: screening as needed. . Hepatitis C: recommended once for everyone age 26-75 . TB: certain at-risk populations, or depending on work requirements and/or travel history Other . Bone Density Test: recommended at age 100

## 2019-12-05 ENCOUNTER — Other Ambulatory Visit: Payer: Self-pay | Admitting: Student in an Organized Health Care Education/Training Program

## 2019-12-05 ENCOUNTER — Other Ambulatory Visit: Payer: Self-pay | Admitting: Osteopathic Medicine

## 2019-12-05 DIAGNOSIS — Z1231 Encounter for screening mammogram for malignant neoplasm of breast: Secondary | ICD-10-CM

## 2019-12-27 ENCOUNTER — Other Ambulatory Visit: Payer: Self-pay | Admitting: Osteopathic Medicine

## 2020-01-02 ENCOUNTER — Other Ambulatory Visit: Payer: Self-pay

## 2020-01-02 ENCOUNTER — Ambulatory Visit
Admission: RE | Admit: 2020-01-02 | Discharge: 2020-01-02 | Disposition: A | Discharge status: Home / Self Care | Payer: 59 | Source: Ambulatory Visit

## 2020-01-02 DIAGNOSIS — Z1231 Encounter for screening mammogram for malignant neoplasm of breast: Secondary | ICD-10-CM

## 2020-01-13 ENCOUNTER — Encounter: Payer: Self-pay | Admitting: Osteopathic Medicine

## 2020-01-21 ENCOUNTER — Encounter: Payer: 59 | Admitting: Sports Medicine

## 2020-03-25 ENCOUNTER — Encounter: Payer: Self-pay | Admitting: Osteopathic Medicine

## 2020-03-30 ENCOUNTER — Ambulatory Visit (INDEPENDENT_AMBULATORY_CARE_PROVIDER_SITE_OTHER): Payer: 59 | Admitting: Osteopathic Medicine

## 2020-03-30 ENCOUNTER — Encounter: Payer: Self-pay | Admitting: Osteopathic Medicine

## 2020-03-30 ENCOUNTER — Other Ambulatory Visit: Payer: Self-pay

## 2020-03-30 VITALS — BP 109/71 | HR 75 | Temp 98.0°F | Wt 237.0 lb

## 2020-03-30 DIAGNOSIS — E6609 Other obesity due to excess calories: Secondary | ICD-10-CM | POA: Diagnosis not present

## 2020-03-30 DIAGNOSIS — F640 Transsexualism: Secondary | ICD-10-CM | POA: Diagnosis not present

## 2020-03-30 DIAGNOSIS — R5383 Other fatigue: Secondary | ICD-10-CM | POA: Diagnosis not present

## 2020-03-30 DIAGNOSIS — E1169 Type 2 diabetes mellitus with other specified complication: Secondary | ICD-10-CM | POA: Diagnosis not present

## 2020-03-30 LAB — POCT GLYCOSYLATED HEMOGLOBIN (HGB A1C): Hemoglobin A1C: 6.3 % — AB (ref 4.0–5.6)

## 2020-03-30 MED ORDER — OZEMPIC (1 MG/DOSE) 2 MG/1.5ML ~~LOC~~ SOPN: 1.0000 mg | PEN_INJECTOR | SUBCUTANEOUS | 3 refills | Status: AC

## 2020-03-30 NOTE — Progress Notes (Signed)
Eric Newton is a 60 y.o. male who presents to  [place] at [facility]  today, 03/30/20, seeking care for the following:  Eric Newton Monitor A1C - doing much better on Ozempic, pt disappointed she's not losing much weight.  . Concern about feeling fatigued/sleepy during the daytime, stable ongoing several months. Wonders if medication effect.      ASSESSMENT & PLAN with other pertinent findings:  The primary encounter diagnosis was Type 2 diabetes mellitus with other specified complication, without long-term current use of insulin (Eric Newton). 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   Fatigue possible d/t SSRI effect, possible beta blocker effect vs other. Would not reduce/d/c BB w/o input from cardiology given CAD / secondary MI prevention    Patient Instructions  Reduce Lexapro from 20 mg (whole pill) to 10 mg (half pill) Will call in a few weeks to see how this is working  Hopefully will help energy levels  Will increase Ozmepic from 0.5 mg to 1 mg weekly Hopefully this will help with weight   Talk with cardiology about blood pressure / heart medicine, might try to reduce or change Metropolol to help energy levels    Orders Placed This Encounter  Procedures  . POCT HgB A1C    Meds ordered this encounter  Medications  . Semaglutide, 1 MG/DOSE, (OZEMPIC, 1 MG/DOSE,) 2 MG/1.5ML SOPN    Sig: Inject 1 mg into the skin once a week.    Dispense:  7.5 mL    Refill:  3     See below for relevant physical exam findings  See below for recent lab and imaging results reviewed  Medications, allergies, PMH, PSH, SocH, FamH reviewed below    Follow-up instructions: Return in about 4 months  (around 07/30/2020) for follow-up A1C .                                        Exam:  BP 109/71 (BP Location: Right Arm, Patient Position: Sitting, Cuff Size: Normal)   Pulse 75   Temp 98 F (36.7 C) (Oral)   Wt 237 lb (107.5 kg)   BMI 34.01 kg/m   Constitutional: VS see above. General Appearance: alert, well-developed, well-nourished, NAD  Neck: No masses, trachea midline.   Respiratory: Normal respiratory effort. no wheeze, no rhonchi, no rales  Cardiovascular: S1/S2 normal, no murmur, no rub/gallop auscultated. RRR.   Musculoskeletal: Gait normal. Symmetric and independent movement of all extremities  Neurological: Normal balance/coordination. No tremor.  Skin: warm, dry, intact.   Psychiatric: Normal judgment/insight. Normal mood and affect. Oriented x3.   Current Meds  Medication Sig  . albuterol (VENTOLIN HFA) 108 (90 Base) MCG/ACT inhaler Inhale into the lungs.  . ASPIRIN 81 PO Aspir-81  . escitalopram (LEXAPRO) 20 MG tablet Take 1 tablet (20 mg total) by mouth daily.  Eric Newton ezetimibe (ZETIA) 10 MG tablet TAKE 1 TABLET(10 MG) BY MOUTH DAILY  . fenofibrate (TRICOR) 145 MG tablet Take 1 tablet (145 mg total) by mouth daily.  . metFORMIN (GLUCOPHAGE) 500 MG tablet TAKE 1 TABLET BY MOUTH TWICE DAILY WITH MEALS  . methocarbamol (ROBAXIN) 750 MG tablet TK 1 T  PO Q 4 H PRN  . metoprolol succinate (TOPROL-XL) 25 MG 24 hr tablet metoprolol succinate ER 25 mg tablet,extended release 24 hr  TAKE 1 TABLET (25 MG TOTAL) BY MOUTH DAILY.  . nitroGLYCERIN (NITROSTAT) 0.4 MG SL tablet Nitrostat 0.4 mg sublingual tablet  Place 1 tablet as needed by sublingual route.  . rosuvastatin (CRESTOR) 40 MG tablet rosuvastatin 40 mg tablet  TAKE 1 TABLET (40 MG TOTAL) BY MOUTH DAILY.  Eric Newton Semaglutide, 1 MG/DOSE, (OZEMPIC, 1 MG/DOSE,) 2 MG/1.5ML SOPN Inject 1 mg into the skin once a week.  . spironolactone (ALDACTONE) 100 MG tablet TAKE 1 TABLET BY MOUTH EVERY  MORNING AND 1/2 TABLET BY MOUTH IN THE AFTERNOON    No Known Allergies  Patient Active Problem List   Diagnosis Date Noted  . 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 History of inferior wall myocardial infarction 09/17/2018  . Additional heart attack (inferolateral wall) ([place]) 09/17/2018  . Left atrial enlargement 09/17/2018  . Type 2 diabetes mellitus with other specified complication (Eric Newton) 59/93/5701    Family History  Problem Relation Age of Onset  . Breast cancer Mother 69  . Hypertension Mother   . Prostate cancer Father   . Cancer Maternal Grandmother   . Cancer Maternal Grandfather     Social History   Tobacco Use  Smoking Status Former Smoker  . Types: Cigarettes  . Quit date: 2014  . Years since quitting: 8.2  Smokeless Tobacco Former Systems developer  Tobacco Comment   2-3 cigs a day when he did smoke    Past Surgical History:  Procedure Laterality Date  . CATARACT EXTRACTION, BILATERAL     30 years ago  . CORONARY STENT PLACEMENT     2006 and 2019    Immunization History  Administered Date(s) Administered  . Influenza, Quadrivalent, Recombinant, Inj, Pf 10/26/2016, 09/25/2017  . Influenza, Seasonal, Injecte, Preservative Fre 10/30/2012, 10/03/2013, 10/29/2013, 11/11/2014, 10/27/2016, 10/01/2017  . Influenza,inj,Quad PF,6+ Mos 10/30/2012, 11/11/2014, 09/27/2018  . Influenza,inj,quad, With Preservative 10/29/2013, 10/30/2015  . Influenza-Unspecified 10/29/2013, 10/29/2019  . Janssen (J&J) SARS-COV-2 Vaccination 03/09/2019, 11/05/2019  . Pneumococcal Polysaccharide-23 12/05/2016  . Tdap 06/12/2013  . Zoster 05/16/2016  . Zoster Recombinat (Shingrix) 07/15/2016    Recent Results (from the past 2160 hour(s))  POCT HgB A1C     Status: Abnormal   Collection Time: 03/30/20  4:26 PM  Result Value Ref Range   Hemoglobin A1C 6.3 (A) 4.0 - 5.6 %   HbA1c POC (<> result, manual entry)     HbA1c, POC (prediabetic range)     HbA1c, POC (controlled diabetic range)       No results found.     All questions at time of visit were answered - patient instructed to contact office with any additional concerns or updates. ER/RTC precautions were reviewed with the patient as applicable.   Please note: manual typing as well as voice recognition software may have been used to produce this document - typos may escape review. Please contact Dr. Sheppard Coil for any needed clarifications.

## 2020-03-30 NOTE — Patient Instructions (Signed)
Reduce Lexapro from 20 mg (whole pill) to 10 mg (half pill) Will call in a few weeks to see how this is working  Hopefully will help energy levels  Will increase Ozmepic from 0.5 mg to 1 mg weekly Hopefully this will help with weight   Talk with cardiology about blood pressure / heart medicine, might try to reduce or change Metropolol to help energy levels

## 2020-04-20 ENCOUNTER — Other Ambulatory Visit: Payer: Self-pay | Admitting: Osteopathic Medicine

## 2020-05-25 ENCOUNTER — Encounter: Payer: Self-pay | Admitting: Osteopathic Medicine

## 2020-05-25 MED ORDER — ESCITALOPRAM OXALATE 10 MG PO TABS: 5.0000 mg | ORAL_TABLET | Freq: Every day | ORAL | 1 refills | Status: AC

## 2020-06-07 ENCOUNTER — Encounter: Payer: Self-pay | Admitting: Osteopathic Medicine

## 2020-06-09 ENCOUNTER — Encounter: Payer: Self-pay | Admitting: Osteopathic Medicine

## 2020-06-09 ENCOUNTER — Other Ambulatory Visit: Payer: Self-pay

## 2020-06-09 ENCOUNTER — Ambulatory Visit (INDEPENDENT_AMBULATORY_CARE_PROVIDER_SITE_OTHER): Payer: 59 | Admitting: Osteopathic Medicine

## 2020-06-09 VITALS — BP 117/67 | HR 70 | Temp 98.4°F | Wt 237.1 lb

## 2020-06-09 DIAGNOSIS — R1013 Epigastric pain: Secondary | ICD-10-CM

## 2020-06-09 DIAGNOSIS — A048 Other specified bacterial intestinal infections: Secondary | ICD-10-CM | POA: Diagnosis not present

## 2020-06-09 DIAGNOSIS — R52 Pain, unspecified: Secondary | ICD-10-CM | POA: Diagnosis not present

## 2020-06-09 DIAGNOSIS — K219 Gastro-esophageal reflux disease without esophagitis: Secondary | ICD-10-CM | POA: Diagnosis not present

## 2020-06-09 MED ORDER — PANTOPRAZOLE SODIUM 40 MG PO TBEC: 40.0000 mg | DELAYED_RELEASE_TABLET | Freq: Two times a day (BID) | ORAL | 0 refills | Status: AC

## 2020-06-09 NOTE — Progress Notes (Signed)
Eric Newton is a 60 y.o. person (transgender male who prefers to go by male name/pronouns in public) who presents to  [place] at [facility]  today, 06/09/20, seeking care for the following:  . Abdominal pain w/ eating - ongoing several weeks, with almost every meal, pain will start shortly after eating a meal and will last about 15 mins or so, sometimes more. Sharp pain in epigastric region, associated w/ bloating sensation but pain doesn't really migrate/radiate. (+)heartburn. No change to BM. No vomiting. Has taken omeprazole a few times .     ASSESSMENT & PLAN with other pertinent findings:  The primary encounter diagnosis was Gastroesophageal reflux disease, unspecified whether esophagitis present. Diagnoses of Epigastric pain and Pain aggravated by eating or drinking were also pertinent to this visit.    Patient Instructions  Plan: I am concerned about possible stomach ulcers!   Pantoprazole antacid twice per day for 6 weeks  Will refer to gastroenterology - they will likely want to do an upper endoscopy to look at the stomach with a camera  We are testing today for H Pylori bacteria which can cause ulcers, if this test is positive, we will start additional antibiotics to fix this infection. False negative tests can happen, so would still follow up with gastroenterology to test the stomach.   If you experience severe pain and/or vomiting anything that looks like red blood or that looks like coffee grounds, go to a hospital / call 911!      Orders Placed This Encounter  Procedures  . H. pylori breath test  . Ambulatory referral to Gastroenterology    Meds ordered this encounter  Medications  . pantoprazole (PROTONIX) 40 MG tablet    Sig: Take 1 tablet (40 mg total) by mouth 2 (two) times daily. For 6 weeks    Dispense:  90 tablet    Refill:  0     See below for relevant physical exam findings  See below for recent  lab and imaging results reviewed  Medications, allergies, PMH, PSH, SocH, FamH reviewed below    Follow-up instructions: Return if symptoms worsen or fail to improve.                                        Exam:  BP 117/67 (BP Location: Left Arm, Patient Position: Sitting, Cuff Size: Large)   Pulse 70   Temp 98.4 F (36.9 C) (Oral)   Wt 237 lb 1.9 oz (107.6 kg)   BMI 34.02 kg/m   Constitutional: VS see above. General Appearance: alert, well-developed, well-nourished, NAD  Neck: No masses, trachea midline.   Respiratory: Normal respiratory effort. no wheeze, no rhonchi, no rales  Cardiovascular: S1/S2 normal, no murmur, no rub/gallop auscultated. RRR.   Musculoskeletal: Gait normal. Symmetric and independent movement of all extremities  Abdominal: non-tender, non-distended, no appreciable organomegaly, neg Murphy's, BS WNLx4 Pain not reproduced by palpation  Neurological: Normal balance/coordination. No tremor.  Skin: warm, dry, intact.   Psychiatric: Normal judgment/insight. Normal mood and affect. Oriented x3.   Current Meds  Medication Sig  . albuterol (VENTOLIN HFA) 108 (90 Base) MCG/ACT inhaler Inhale into the lungs.  . ASPIRIN 81 PO Aspir-81  . escitalopram (LEXAPRO) 10 MG tablet Take 0.5 tablets (5 mg total) by mouth daily.  [name] ezetimibe (ZETIA) 10 MG tablet TAKE 1 TABLET(10 MG) BY MOUTH DAILY  .  fenofibrate (TRICOR) 145 MG tablet TAKE 1 TABLET(145 MG) BY MOUTH DAILY  . metFORMIN (GLUCOPHAGE) 500 MG tablet TAKE 1 TABLET BY MOUTH TWICE DAILY WITH MEALS  . methocarbamol (ROBAXIN) 750 MG tablet TK 1 T PO Q 4 H PRN  . metoprolol succinate (TOPROL-XL) 25 MG 24 hr tablet metoprolol succinate ER 25 mg tablet,extended release 24 hr  TAKE 1 TABLET (25 MG TOTAL) BY MOUTH DAILY.  . nitroGLYCERIN (NITROSTAT) 0.4 MG SL tablet Nitrostat 0.4 mg sublingual tablet  Place 1 tablet as needed by sublingual route.  . pantoprazole (PROTONIX) 40 MG  tablet Take 1 tablet (40 mg total) by mouth 2 (two) times daily. For 6 weeks  . rosuvastatin (CRESTOR) 40 MG tablet rosuvastatin 40 mg tablet  TAKE 1 TABLET (40 MG TOTAL) BY MOUTH DAILY.  [name] Semaglutide, 1 MG/DOSE, (OZEMPIC, 1 MG/DOSE,) 2 MG/1.5ML SOPN Inject 1 mg into the skin once a week.  . spironolactone (ALDACTONE) 100 MG tablet TAKE 1 TABLET BY MOUTH EVERY MORNING AND 1/2 TABLET BY MOUTH IN THE AFTERNOON    No Known Allergies  Patient Active Problem List   Diagnosis Date Noted  . Transfemale - AMAB, presents as male in public d/t concerns for safety, is out to family/friends and doing well on estrogen and spironolactone  09/17/2018  . History of inferior wall myocardial infarction 09/17/2018  . Additional heart attack (inferolateral wall) ([place]) 09/17/2018  . Left atrial enlargement 09/17/2018  . Type 2 diabetes mellitus with other specified complication ([place]) 03/47/4259    Family History  Problem Relation Age of Onset  . Breast cancer Mother 37  . Hypertension Mother   . Prostate cancer Father   . Cancer Maternal Grandmother   . Cancer Maternal Grandfather     Social History   Tobacco Use  Smoking Status Former Smoker  . Types: Cigarettes  . Quit date: 2014  . Years since quitting: 8.4  Smokeless Tobacco Former Systems developer  Tobacco Comment   2-3 cigs a day when he did smoke    Past Surgical History:  Procedure Laterality Date  . CATARACT EXTRACTION, BILATERAL     30 years ago  . CORONARY STENT PLACEMENT     2006 and 2019    Immunization History  Administered Date(s) Administered  . Influenza, Quadrivalent, Recombinant, Inj, Pf 10/26/2016, 09/25/2017  . Influenza, Seasonal, Injecte, Preservative Fre 10/30/2012, 10/03/2013, 10/29/2013, 11/11/2014, 10/27/2016, 10/01/2017  . Influenza,inj,Quad PF,6+ Mos 10/30/2012, 11/11/2014, 09/27/2018  . Influenza,inj,quad, With Preservative 10/29/2013, 10/30/2015  . Influenza-Unspecified 10/29/2013, 10/29/2019  . Janssen (J&J)  SARS-COV-2 Vaccination 03/09/2019, 11/05/2019  . Pneumococcal Polysaccharide-23 12/05/2016  . Tdap 06/12/2013  . Zoster Recombinat (Shingrix) 07/15/2016  . Zoster, Live 05/16/2016    Recent Results (from the past 2160 hour(s))  POCT HgB A1C     Status: Abnormal   Collection Time: 03/30/20  4:26 PM  Result Value Ref Range   Hemoglobin A1C 6.3 (A) 4.0 - 5.6 %   HbA1c POC (<> result, manual entry)     HbA1c, POC (prediabetic range)     HbA1c, POC (controlled diabetic range)      No results found.     All questions at time of visit were answered - patient instructed to contact office with any additional concerns or updates. ER/RTC precautions were reviewed with the patient as applicable.   Please note: manual typing as well as voice recognition software may have been used to produce this document - typos may escape review. Please contact Dr. Sheppard Coil for any  needed clarifications.

## 2020-06-09 NOTE — Patient Instructions (Signed)
Plan: I am concerned about possible stomach ulcers!   Pantoprazole antacid twice per day for 6 weeks  Will refer to gastroenterology - they will likely want to do an upper endoscopy to look at the stomach with a camera  We are testing today for H Pylori bacteria which can cause ulcers, if this test is positive, we will start additional antibiotics to fix this infection. False negative tests can happen, so would still follow up with gastroenterology to test the stomach.   If you experience severe pain and/or vomiting anything that looks like red blood or that looks like coffee grounds, go to a hospital / call 911!

## 2020-06-10 ENCOUNTER — Encounter: Payer: Self-pay | Admitting: Osteopathic Medicine

## 2020-06-10 DIAGNOSIS — A048 Other specified bacterial intestinal infections: Secondary | ICD-10-CM | POA: Insufficient documentation

## 2020-06-10 LAB — H. PYLORI BREATH TEST: H. pylori Breath Test: DETECTED — AB

## 2020-06-10 MED ORDER — CLARITHROMYCIN 500 MG PO TABS: 500.0000 mg | ORAL_TABLET | Freq: Two times a day (BID) | ORAL | 0 refills | Status: AC

## 2020-06-10 MED ORDER — AMOXICILLIN 500 MG PO TABS: 1000.0000 mg | ORAL_TABLET | Freq: Two times a day (BID) | ORAL | 0 refills | Status: AC

## 2020-06-10 NOTE — Addendum Note (Signed)
Addended by: Maryla Morrow on: 06/10/2020 01:35 PM   Modules accepted: Orders

## 2020-06-17 LAB — HM DIABETES EYE EXAM

## 2020-08-03 ENCOUNTER — Encounter: Payer: Self-pay | Admitting: Osteopathic Medicine

## 2020-08-04 ENCOUNTER — Other Ambulatory Visit: Payer: Self-pay | Admitting: Osteopathic Medicine

## 2020-09-22 ENCOUNTER — Encounter: Payer: Self-pay | Admitting: Osteopathic Medicine

## 2020-09-28 ENCOUNTER — Other Ambulatory Visit: Payer: Self-pay | Admitting: Osteopathic Medicine

## 2020-10-01 ENCOUNTER — Other Ambulatory Visit: Payer: Self-pay

## 2020-10-01 DIAGNOSIS — I517 Cardiomegaly: Secondary | ICD-10-CM

## 2020-10-01 MED ORDER — METOPROLOL SUCCINATE ER 25 MG PO TB24: ORAL_TABLET | ORAL | 0 refills | Status: AC

## 2020-10-04 ENCOUNTER — Other Ambulatory Visit: Payer: Self-pay | Admitting: Osteopathic Medicine

## 2020-10-06 ENCOUNTER — Encounter: Payer: Self-pay | Admitting: Osteopathic Medicine

## 2020-10-06 NOTE — Telephone Encounter (Signed)
Called patient to notify he needs an appointment to re-establish care. Will call the front desk to do so.

## 2020-10-06 NOTE — Telephone Encounter (Signed)
Needs to re-establish care if not been refilled in one year.

## 2020-10-20 IMAGING — MG DIGITAL SCREENING BILAT W/ TOMO W/ CAD
8 series · 8 of 24 positions shown · non-contrast
Comparison: Previous exam(s).

CLINICAL DATA: Screening.

EXAM:
DIGITAL SCREENING BILATERAL MAMMOGRAM WITH TOMO AND CAD

[R MLO synth-2D]
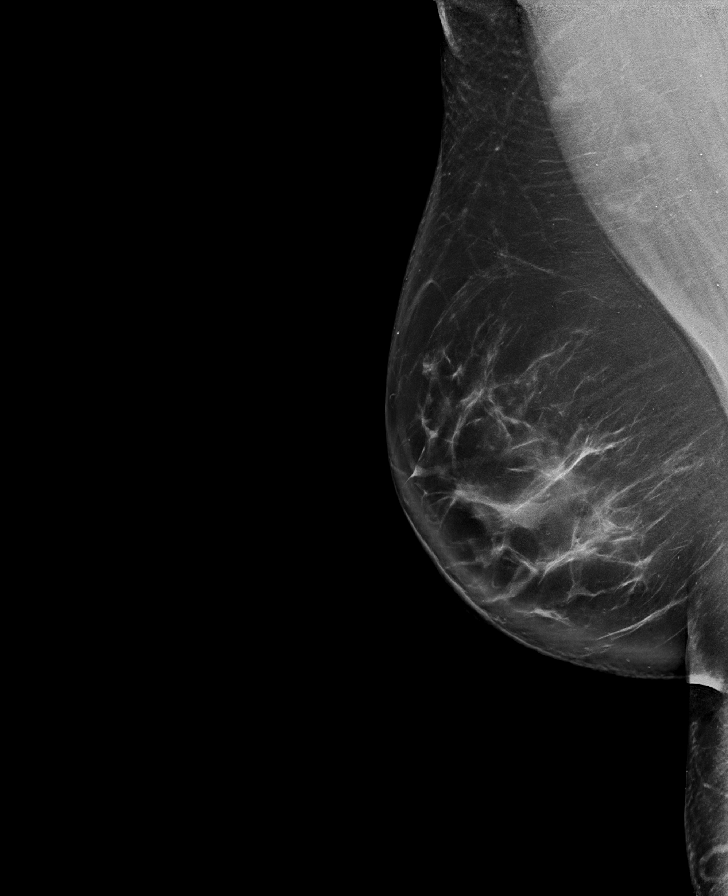

[R CC synth-2D]
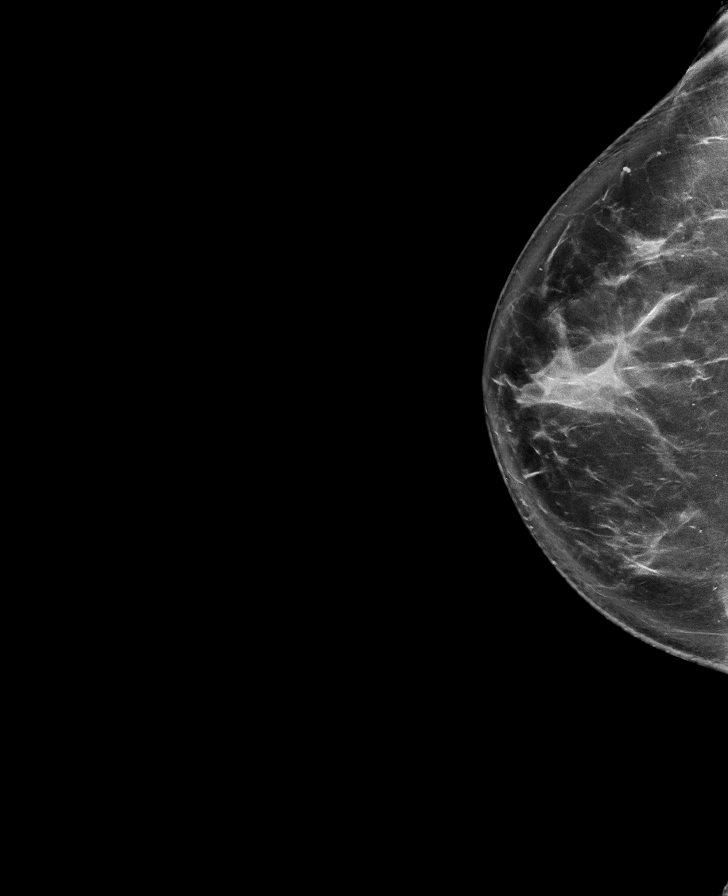

[L CC synth-2D]
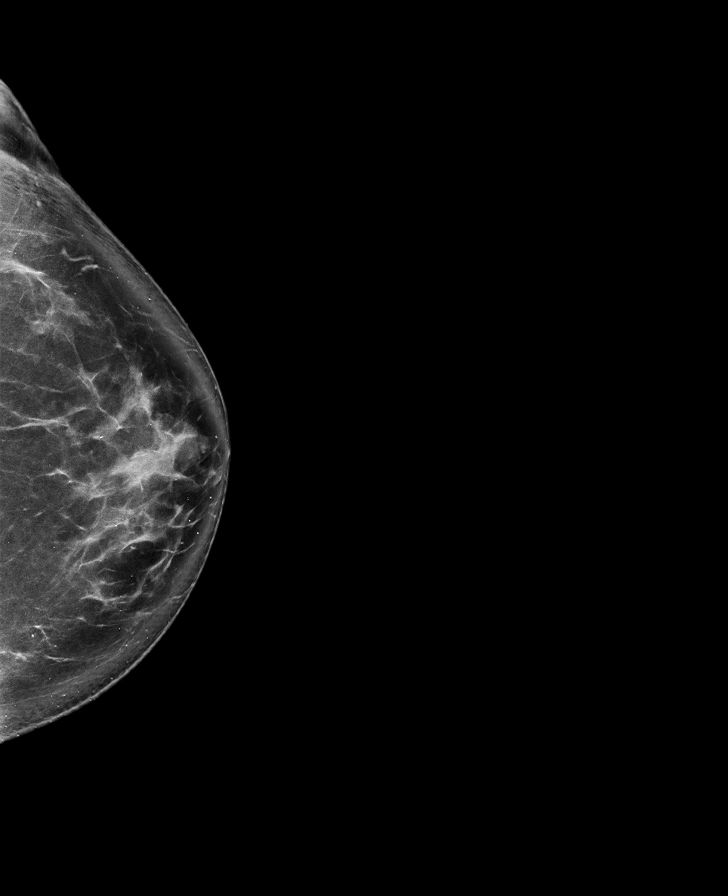

[L MLO synth-2D]
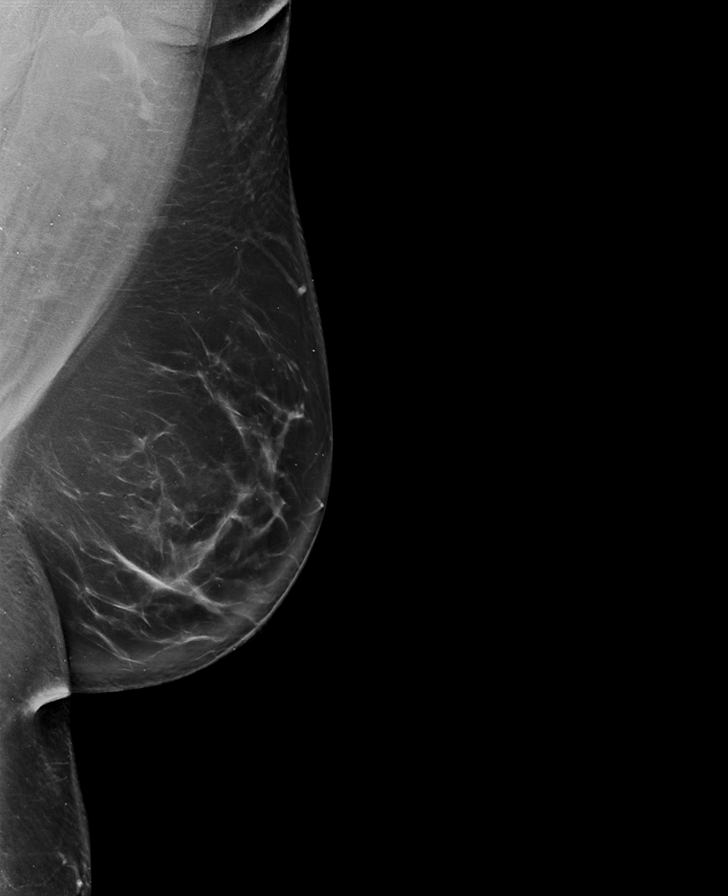

[L CC tomo · tomo slice 51/101.0]
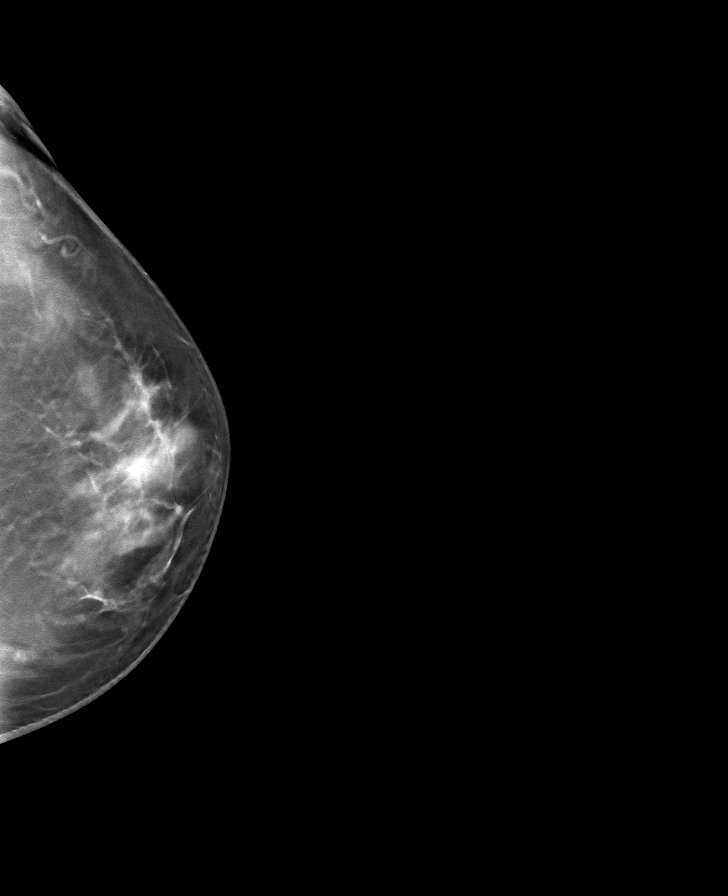

[L MLO tomo · tomo slice 57/113.0]
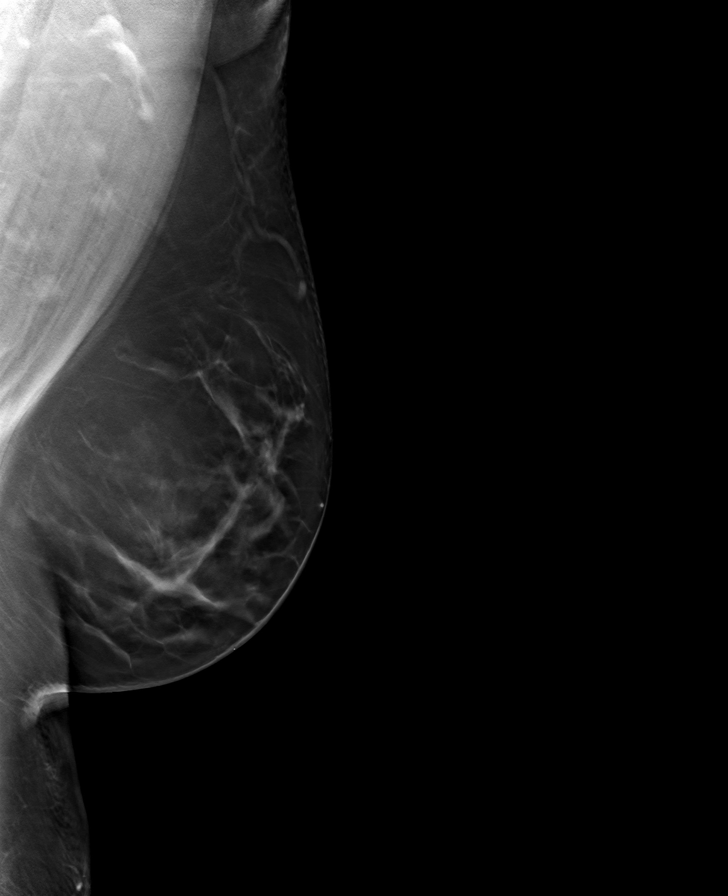

[R MLO tomo · tomo slice 57/112.0]
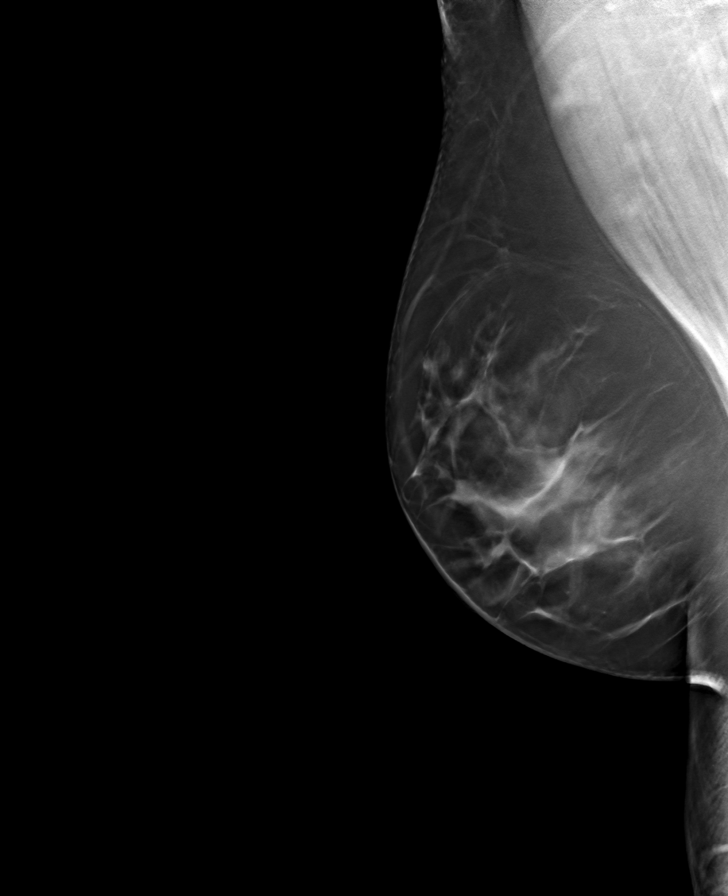

[R CC tomo · tomo slice 51/100.0]
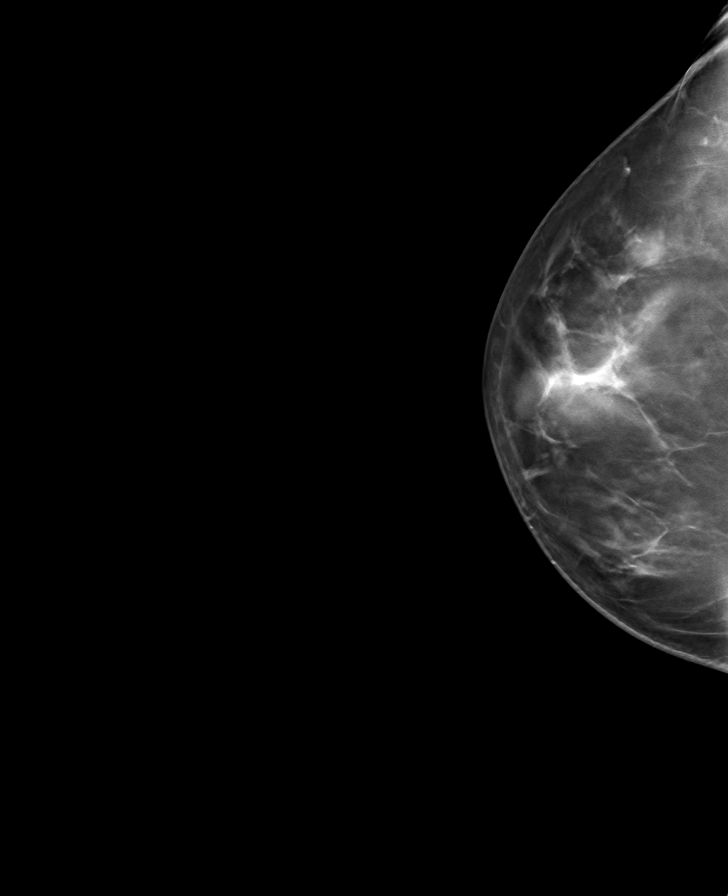

[8 of 24 positions shown; findings below may reference images not displayed]

ACR Breast Density Category c: The breast tissue is heterogeneously
dense, which may obscure small masses.
FINDINGS: In the right breast an asymmetry requires further evaluation.

In the left breast a possible mass requires further evaluation.

Images were processed with CAD.
IMPRESSION: Further evaluation is suggested for possible asymmetry in the right
breast.

Further evaluation is suggested for possible mass in the left
breast.

RECOMMENDATION:
Diagnostic mammogram and possibly ultrasound of both breasts.
(Code:[id number])

The patient will be contacted regarding the findings, and additional
imaging will be scheduled.

BI-RADS CATEGORY  0: Incomplete. Need additional imaging evaluation
and/or prior mammograms for comparison.

## 2020-12-14 ENCOUNTER — Other Ambulatory Visit: Payer: Self-pay | Admitting: *Deleted

## 2020-12-14 DIAGNOSIS — E782 Mixed hyperlipidemia: Secondary | ICD-10-CM

## 2020-12-14 MED ORDER — EZETIMIBE 10 MG PO TABS: ORAL_TABLET | ORAL | 0 refills | Status: AC

## 2021-02-22 ENCOUNTER — Other Ambulatory Visit: Payer: Self-pay | Admitting: Family Medicine

## 2021-02-22 DIAGNOSIS — E782 Mixed hyperlipidemia: Secondary | ICD-10-CM
